# Patient Record
Sex: Female | Born: 1935 | Race: Black or African American | Hispanic: No | Smoking: Never smoker
Health system: Southern US, Community
[De-identification: ages and names within clinical notes are randomized; demographics above are authoritative.]

## PROBLEM LIST (undated history)

## (undated) DIAGNOSIS — C569 Malignant neoplasm of unspecified ovary: Secondary | ICD-10-CM

## (undated) DIAGNOSIS — F0390 Unspecified dementia without behavioral disturbance: Secondary | ICD-10-CM

## (undated) DIAGNOSIS — G629 Polyneuropathy, unspecified: Secondary | ICD-10-CM

## (undated) DIAGNOSIS — F329 Major depressive disorder, single episode, unspecified: Secondary | ICD-10-CM

## (undated) DIAGNOSIS — M1990 Unspecified osteoarthritis, unspecified site: Secondary | ICD-10-CM

## (undated) DIAGNOSIS — H409 Unspecified glaucoma: Secondary | ICD-10-CM

## (undated) DIAGNOSIS — F03A0 Unspecified dementia, mild, without behavioral disturbance, psychotic disturbance, mood disturbance, and anxiety: Secondary | ICD-10-CM

## (undated) DIAGNOSIS — H9190 Unspecified hearing loss, unspecified ear: Secondary | ICD-10-CM

## (undated) DIAGNOSIS — F32A Depression, unspecified: Secondary | ICD-10-CM

## (undated) DIAGNOSIS — I10 Essential (primary) hypertension: Secondary | ICD-10-CM

## (undated) HISTORY — PX: CATARACT EXTRACTION, BILATERAL: SHX1313

## (undated) HISTORY — PX: PARTIAL HYSTERECTOMY: SHX80

## (undated) HISTORY — PX: SPINE SURGERY: SHX786

---

## 2015-10-27 ENCOUNTER — Telehealth: Payer: Self-pay | Admitting: Cardiovascular Disease

## 2015-10-27 NOTE — Telephone Encounter (Signed)
New MEssage  Pt grand daughter called to cancel appt w/ Dr Burt Knack for 6/15- and requested to speak w/ RN. Please call back and discuss.

## 2015-10-27 NOTE — Telephone Encounter (Signed)
I left Yonna a detailed message in regards to pt's appointment.  Apurva plans to contact the office and reschedule appointment once the pt is enrolled in [facility] program and medical records are received.

## 2015-10-29 ENCOUNTER — Ambulatory Visit: Payer: Self-pay | Admitting: Cardiovascular Disease

## 2016-03-21 ENCOUNTER — Other Ambulatory Visit: Payer: Self-pay | Admitting: Nurse Practitioner

## 2016-03-21 ENCOUNTER — Ambulatory Visit
Admission: RE | Admit: 2016-03-21 | Discharge: 2016-03-21 | Disposition: A | Discharge status: Home / Self Care | Payer: No Typology Code available for payment source | Source: Ambulatory Visit | Attending: Nurse Practitioner | Admitting: Nurse Practitioner

## 2016-03-21 DIAGNOSIS — R109 Unspecified abdominal pain: Secondary | ICD-10-CM

## 2017-04-13 ENCOUNTER — Observation Stay (HOSPITAL_COMMUNITY)
Admission: EM | Admit: 2017-04-13 | Discharge: 2017-04-14 | Disposition: A | Discharge status: Home / Self Care | Payer: Medicare (Managed Care) | Attending: Internal Medicine | Admitting: Internal Medicine

## 2017-04-13 ENCOUNTER — Emergency Department (HOSPITAL_COMMUNITY): Payer: Medicare (Managed Care)

## 2017-04-13 ENCOUNTER — Encounter (HOSPITAL_COMMUNITY): Payer: Self-pay | Admitting: Emergency Medicine

## 2017-04-13 DIAGNOSIS — R932 Abnormal findings on diagnostic imaging of liver and biliary tract: Secondary | ICD-10-CM | POA: Insufficient documentation

## 2017-04-13 DIAGNOSIS — I82411 Acute embolism and thrombosis of right femoral vein: Secondary | ICD-10-CM | POA: Insufficient documentation

## 2017-04-13 DIAGNOSIS — F0390 Unspecified dementia without behavioral disturbance: Secondary | ICD-10-CM | POA: Diagnosis not present

## 2017-04-13 DIAGNOSIS — Z8543 Personal history of malignant neoplasm of ovary: Secondary | ICD-10-CM | POA: Insufficient documentation

## 2017-04-13 DIAGNOSIS — K7689 Other specified diseases of liver: Secondary | ICD-10-CM | POA: Diagnosis present

## 2017-04-13 DIAGNOSIS — Z91018 Allergy to other foods: Secondary | ICD-10-CM | POA: Insufficient documentation

## 2017-04-13 DIAGNOSIS — Z79899 Other long term (current) drug therapy: Secondary | ICD-10-CM | POA: Diagnosis not present

## 2017-04-13 DIAGNOSIS — R1084 Generalized abdominal pain: Secondary | ICD-10-CM

## 2017-04-13 DIAGNOSIS — G629 Polyneuropathy, unspecified: Secondary | ICD-10-CM | POA: Insufficient documentation

## 2017-04-13 DIAGNOSIS — Z9841 Cataract extraction status, right eye: Secondary | ICD-10-CM | POA: Diagnosis not present

## 2017-04-13 DIAGNOSIS — R1010 Upper abdominal pain, unspecified: Secondary | ICD-10-CM

## 2017-04-13 DIAGNOSIS — R0789 Other chest pain: Secondary | ICD-10-CM | POA: Diagnosis not present

## 2017-04-13 DIAGNOSIS — Z66 Do not resuscitate: Secondary | ICD-10-CM | POA: Diagnosis not present

## 2017-04-13 DIAGNOSIS — R109 Unspecified abdominal pain: Secondary | ICD-10-CM | POA: Diagnosis present

## 2017-04-13 DIAGNOSIS — H409 Unspecified glaucoma: Secondary | ICD-10-CM | POA: Insufficient documentation

## 2017-04-13 DIAGNOSIS — I4581 Long QT syndrome: Secondary | ICD-10-CM | POA: Diagnosis not present

## 2017-04-13 DIAGNOSIS — Z880 Allergy status to penicillin: Secondary | ICD-10-CM | POA: Insufficient documentation

## 2017-04-13 DIAGNOSIS — Z90710 Acquired absence of both cervix and uterus: Secondary | ICD-10-CM | POA: Diagnosis not present

## 2017-04-13 DIAGNOSIS — I10 Essential (primary) hypertension: Secondary | ICD-10-CM | POA: Diagnosis present

## 2017-04-13 DIAGNOSIS — Z9842 Cataract extraction status, left eye: Secondary | ICD-10-CM | POA: Diagnosis not present

## 2017-04-13 DIAGNOSIS — I82419 Acute embolism and thrombosis of unspecified femoral vein: Secondary | ICD-10-CM | POA: Diagnosis present

## 2017-04-13 DIAGNOSIS — R079 Chest pain, unspecified: Secondary | ICD-10-CM

## 2017-04-13 DIAGNOSIS — F329 Major depressive disorder, single episode, unspecified: Secondary | ICD-10-CM | POA: Diagnosis not present

## 2017-04-13 DIAGNOSIS — Z9889 Other specified postprocedural states: Secondary | ICD-10-CM | POA: Diagnosis not present

## 2017-04-13 DIAGNOSIS — E739 Lactose intolerance, unspecified: Secondary | ICD-10-CM | POA: Insufficient documentation

## 2017-04-13 HISTORY — DX: Unspecified osteoarthritis, unspecified site: M19.90

## 2017-04-13 HISTORY — DX: Malignant neoplasm of unspecified ovary: C56.9

## 2017-04-13 HISTORY — DX: Unspecified hearing loss, unspecified ear: H91.90

## 2017-04-13 HISTORY — DX: Unspecified dementia, mild, without behavioral disturbance, psychotic disturbance, mood disturbance, and anxiety: F03.A0

## 2017-04-13 HISTORY — DX: Unspecified dementia without behavioral disturbance: F03.90

## 2017-04-13 HISTORY — DX: Essential (primary) hypertension: I10

## 2017-04-13 HISTORY — DX: Unspecified glaucoma: H40.9

## 2017-04-13 HISTORY — DX: Major depressive disorder, single episode, unspecified: F32.9

## 2017-04-13 HISTORY — DX: Depression, unspecified: F32.A

## 2017-04-13 HISTORY — DX: Polyneuropathy, unspecified: G62.9

## 2017-04-13 LAB — URINALYSIS, ROUTINE W REFLEX MICROSCOPIC
Bilirubin Urine: NEGATIVE
Glucose, UA: NEGATIVE mg/dL
Hgb urine dipstick: NEGATIVE
Ketones, ur: NEGATIVE mg/dL
Nitrite: NEGATIVE
PH: 7 (ref 5.0–8.0)
Protein, ur: NEGATIVE mg/dL
SPECIFIC GRAVITY, URINE: 1.014 (ref 1.005–1.030)

## 2017-04-13 LAB — I-STAT TROPONIN, ED: TROPONIN I, POC: 0 ng/mL (ref 0.00–0.08)

## 2017-04-13 LAB — COMPREHENSIVE METABOLIC PANEL
ALBUMIN: 3.8 g/dL (ref 3.5–5.0)
ALT: 14 U/L (ref 14–54)
AST: 22 U/L (ref 15–41)
Alkaline Phosphatase: 81 U/L (ref 38–126)
Anion gap: 9 (ref 5–15)
BILIRUBIN TOTAL: 0.5 mg/dL (ref 0.3–1.2)
BUN: 12 mg/dL (ref 6–20)
CHLORIDE: 104 mmol/L (ref 101–111)
CO2: 28 mmol/L (ref 22–32)
Calcium: 10.9 mg/dL — ABNORMAL HIGH (ref 8.9–10.3)
Creatinine, Ser: 0.67 mg/dL (ref 0.44–1.00)
GFR calc Af Amer: >60 mL/min (ref >60)
GFR calc non Af Amer: >60 mL/min (ref >60)
GLUCOSE: 114 mg/dL — AB (ref 65–99)
POTASSIUM: 3.4 mmol/L — AB (ref 3.5–5.1)
Sodium: 141 mmol/L (ref 135–145)
Total Protein: 8.2 g/dL — ABNORMAL HIGH (ref 6.5–8.1)

## 2017-04-13 LAB — CBC
HEMATOCRIT: 40.9 % (ref 36.0–46.0)
Hemoglobin: 13.4 g/dL (ref 12.0–15.0)
MCH: 28.6 pg (ref 26.0–34.0)
MCHC: 32.8 g/dL (ref 30.0–36.0)
MCV: 87.2 fL (ref 78.0–100.0)
Platelets: 227 10*3/uL (ref 150–400)
RBC: 4.69 MIL/uL (ref 3.87–5.11)
RDW: 14.9 % (ref 11.5–15.5)
WBC: 10.9 10*3/uL — ABNORMAL HIGH (ref 4.0–10.5)

## 2017-04-13 LAB — TROPONIN I: Troponin I: <0.03 ng/mL (ref <0.03)

## 2017-04-13 LAB — LIPASE, BLOOD: LIPASE: 22 U/L (ref 11–51)

## 2017-04-13 MED ORDER — HYDRALAZINE HCL 20 MG/ML IJ SOLN: 5.0000 mg | INTRAMUSCULAR | Status: DC | PRN

## 2017-04-13 MED ORDER — IOPAMIDOL (ISOVUE-300) INJECTION 61%
INTRAVENOUS | Status: AC
  Administered 2017-04-13: 100 mL via INTRAVENOUS
  Filled 2017-04-13: qty 100

## 2017-04-13 MED ORDER — PANTOPRAZOLE SODIUM 40 MG IV SOLR
40.0000 mg | Freq: Once | INTRAVENOUS | Status: AC
  Administered 2017-04-13: 40 mg via INTRAVENOUS
  Filled 2017-04-13: qty 40

## 2017-04-13 NOTE — ED Triage Notes (Addendum)
Per EMS, patient from [facility], c/o RUQ pain x1 day. Reports pain decreases when patient passes gas. Denies N/V/D. Hx early onset dementia.  Patient also c/o cough x1 week with increased SOB since last night. Denies chest pain. A&Ox4.   BP 190/100 O2 96% 4L

## 2017-04-13 NOTE — ED Provider Notes (Signed)
[facility] DEPT Provider Note   CSN: [id number] Arrival date & time: 04/13/17  1347     History   Chief Complaint Chief Complaint  Patient presents with  . Abdominal Pain    HPI Karen Thompson is a 83 y.o. female.  Patient complains of abdominal pain and chest pain today.   The history is provided by the patient. No language interpreter was used.  Abdominal Pain   This is a new problem. The current episode started 12 to 24 hours ago. The problem occurs constantly. The problem has not changed since onset.The pain is associated with an unknown factor. The pain is located in the epigastric region. The quality of the pain is dull. The pain is at a severity of 5/10. The pain is moderate. Pertinent negatives include anorexia, diarrhea, frequency, hematuria and headaches. Nothing aggravates the symptoms.    History reviewed. No pertinent past medical history.  There are no active problems to display for this patient.   History reviewed. No pertinent surgical history.  OB History    No data available       Home Medications    Prior to Admission medications   Medication Sig Start Date End Date Taking? Authorizing Provider  acetaminophen (TYLENOL 8 HOUR) 650 MG CR tablet Take 650 mg by mouth every 8 (eight) hours as needed for pain.   Yes [provider]  amLODipine (NORVASC) 5 MG tablet Take 5 mg by mouth daily.   Yes [provider]  Brinzolamide-Brimonidine (SIMBRINZA) 1-0.2 % SUSP Apply 1 drop to eye 2 (two) times daily.   Yes [provider]  cholecalciferol (VITAMIN D) 1000 units tablet Take 1,000 Units by mouth daily.   Yes [provider]  cholestyramine light (PREVALITE) 4 GM/DOSE powder Take 4 g by mouth daily as needed (diarrhea).   Yes [provider]  citalopram (CELEXA) 20 MG tablet Take 20 mg by mouth daily.   Yes [provider]  fexofenadine (ALLEGRA) 180 MG tablet Take 180 mg  by mouth daily.   Yes [provider]  gabapentin (NEURONTIN) 100 MG capsule Take 100-200 mg by mouth 2 (two) times daily. Take 1 capsule in the am and Take 2 capsules in the afternoon.   Yes [provider]  Liniments (SALONPAS EX) Apply 1 patch topically 3 (three) times daily as needed (pain).   Yes [provider]  memantine (NAMENDA) 10 MG tablet Take 10 mg by mouth 2 (two) times daily.   Yes [provider]  Menthol, Topical Analgesic, (BIOFREEZE) 4 % GEL Apply 1 application topically 2 (two) times daily as needed (pain).   Yes [provider]  mirtazapine (REMERON) 15 MG tablet Take 15 mg by mouth at bedtime.   Yes [provider]  Netarsudil Dimesylate (RHOPRESSA) 0.02 % SOLN Apply 1 drop to eye at bedtime.   Yes [provider]  olmesartan (BENICAR) 40 MG tablet Take 40 mg by mouth daily.   Yes [provider]  travoprost, benzalkonium, (TRAVATAN) 0.004 % ophthalmic solution Place 1 drop into both eyes at bedtime.   Yes [provider]  ENSURE (ENSURE) Take 237 mLs by mouth daily.    [provider]    Family History No family history on file.  Social History Social History   Tobacco Use  . Smoking status: Not on file  Substance Use Topics  . Alcohol use: Not on file  . Drug use: Not on file  Allergies   Lactose intolerance (gi); Penicillins; and Tomato   Review of Systems Review of Systems  Constitutional: Negative for appetite change and fatigue.  HENT: Negative for congestion, ear discharge and sinus pressure.   Eyes: Negative for discharge.  Respiratory: Negative for cough.   Cardiovascular: Negative for chest pain.  Gastrointestinal: Positive for abdominal pain. Negative for anorexia and diarrhea.  Genitourinary: Negative for frequency and hematuria.  Musculoskeletal: Negative for back pain.  Skin: Negative for rash.  Neurological: Negative for seizures and headaches.    Psychiatric/Behavioral: Negative for hallucinations.     Physical Exam Updated Vital Signs BP (!) 182/77   Pulse 90   Temp 98.4 F (36.9 C) (Oral)   Resp (!) 25   SpO2 92%   Physical Exam  Constitutional: She is oriented to person, place, and time. She appears well-developed.  HENT:  Head: Normocephalic.  Eyes: Conjunctivae and EOM are normal. No scleral icterus.  Neck: Neck supple. No thyromegaly present.  Cardiovascular: Normal rate and regular rhythm. Exam reveals no gallop and no friction rub.  No murmur heard. Pulmonary/Chest: No stridor. She has no wheezes. She has no rales. She exhibits no tenderness.  Abdominal: She exhibits no distension. There is tenderness. There is no rebound.  Tenderness to epigastric area  Musculoskeletal: Normal range of motion. She exhibits no edema.  Lymphadenopathy:    She has no cervical adenopathy.  Neurological: She is oriented to person, place, and time. She exhibits normal muscle tone. Coordination normal.  Skin: No rash noted. No erythema.  Psychiatric: She has a normal mood and affect. Her behavior is normal.     ED Treatments / Results  Labs (all labs ordered are listed, but only abnormal results are displayed) Labs Reviewed  COMPREHENSIVE METABOLIC PANEL - Abnormal; Notable for the following components:      Result Value   Potassium 3.4 (*)    Glucose, Bld 114 (*)    Calcium 10.9 (*)    Total Protein 8.2 (*)    All other components within normal limits  CBC - Abnormal; Notable for the following components:   WBC 10.9 (*)    All other components within normal limits  URINALYSIS, ROUTINE W REFLEX MICROSCOPIC - Abnormal; Notable for the following components:   Leukocytes, UA TRACE (*)    Bacteria, UA RARE (*)    Squamous Epithelial / LPF 0-5 (*)    All other components within normal limits  LIPASE, BLOOD  TROPONIN I  I-STAT TROPONIN, ED    EKG  EKG Interpretation  Date/Time:  Thursday April 13 2017 14:17:51  EST Ventricular Rate:  78 PR Interval:    QRS Duration: 86 QT Interval:  533 QTC Calculation: 608 R Axis:   51 Text Interpretation:  Sinus rhythm Left ventricular hypertrophy Prolonged QT interval Baseline wander in lead(s) II aVF V3 V6 Confirmed by Milton Ferguson [phone]) on 04/13/2017 9:48:00 PM       Radiology Dg Chest 2 View  Result Date: 04/13/2017 CLINICAL DATA:  Chest pain and cough for 2 days. EXAM: CHEST  2 VIEW COMPARISON:  03/21/2016 FINDINGS: AP and lateral views of the chest show low volumes. Bibasilar airspace disease is evident. Cardiopericardial silhouette is at upper limits of normal for size. Bones are diffusely demineralized. Telemetry leads overlie the chest. IMPRESSION: Bibasilar airspace disease. This may be atelectatic although pneumonia is not excluded. Electronically Signed   By: Misty Stanley M.D.   On: 04/13/2017 15:58   Ct Abdomen Pelvis W Contrast  Result Date: 04/13/2017 CLINICAL DATA:  Abdominal distension.  Right upper quadrant pain. EXAM: CT ABDOMEN AND PELVIS WITH CONTRAST TECHNIQUE: Multidetector CT imaging of the abdomen and pelvis was performed using the standard protocol following bolus administration of intravenous contrast. CONTRAST:  175mL ISOVUE-300 IOPAMIDOL (ISOVUE-300) INJECTION 61% COMPARISON:  Chest radiograph today. FINDINGS: Lower chest: Breathing motion artifact limits assessment. Patchy bibasilar opacities, right greater than left. Heart size upper normal. Aortic atherosclerosis. Hepatobiliary: Motion artifact the liver. Multiple low-density hepatic lesions throughout both lobes. Largest lesion in the left lobe measures 2.7 cm. Many of these lesions represent simple cysts, however some are intermediate density, for example image 20 series 2 in the right and left lobes. Simple fluid density is confirmed in some of these lesion on delayed phase imaging, and it is unclear whether the intermediate density is secondary to volume averaging secondary to  motion or true non cystic nature of these lesions. Gallbladder is partially distended. No calcified gallstone. Prominent distal common bile duct 8 mm distally. Pancreas: Motion artifact. Parenchymal atrophy. No evidence peripancreatic inflammation. No definite ductal dilatation. Spleen: Motion artifacts.  Normal in size. Adrenals/Urinary Tract: No definite adrenal nodule, motion limited exam. No hydronephrosis or perinephric edema. Partially duplicated left renal collecting system. Multiple bilateral renal cysts, largest on the left kidney anteriorly midportion measuring 3.8 cm. Stomach/Bowel: Stomach physiologically distended. No evidence of bowel inflammation, wall thickening or obstruction. Descending and sigmoid diverticulosis without diverticulitis. Moderate colonic stool burden. The appendix is not visualized, no pericecal or right lower quadrant inflammation. Vascular/Lymphatic: Low-density filling defect in the right common femoral and included femoral vein consistent with DVT. Moderate to advanced aortic atherosclerosis and tortuosity. Advanced bi-iliac atherosclerosis with at least high-grade stenosis of the left external iliac artery, distal flow is noted. Examination not tailored for arterial evaluation. No aneurysm. Motion limits assessment for adenopathy, no bulky adenopathy is seen. Reproductive: Normal for age atrophic uterus. No definite adnexal mass, fluid-filled bowel in the pelvis partially limited adnexal assessment. Other: No ascites or free air.  No intra-abdominal abscess. Musculoskeletal: Moderate L2 compression fracture, likely remote. Multilevel degenerative change throughout spine. The bones are under mineralized. Bilateral iliac bone heterogeneity without defined lesion. IMPRESSION: 1. Hypodense hepatic lesions, which represent simple cysts. Some of these lesions may measure slightly higher than simple fluid density, however there is significant motion artifact through the liver and this  may be secondary to volume averaging. MRI is the preferred method for characterization, however given patient's inability to breath hold would be nondiagnostic in this case. 2. Filling defect within the right common femoral and femoral vein consistent with DVT, nonocclusive. 3. Colonic diverticulosis without diverticulitis. Moderate colonic stool burden can be seen with constipation, no fecal impaction or bowel obstruction. 4. Aorta bi-iliac atherosclerosis, moderate to advanced. 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 5. L2 compression fracture is likely remote. These results were called by telephone at the time of interpretation on 04/13/2017 at 9:25 pm to Dr. Milton Ferguson , who verbally acknowledged these results. Electronically Signed   By: Jeb Levering M.D.   On: 04/13/2017 21:25    Procedures Procedures (including critical care time)  Medications Ordered in ED Medications  iopamidol (ISOVUE-300) 61 % injection (100 mLs Intravenous Contrast Given 04/13/17 2026)  pantoprazole (PROTONIX) injection 40 mg (40 mg Intravenous Given 04/13/17 2159)     Initial Impression / Assessment and Plan / ED Course  I have reviewed the triage vital signs and the  nursing notes.  Pertinent labs & imaging results that were available during my care of the patient were reviewed by me and considered in my medical decision making (see chart for details).     Patient with abdominal pain and DVT.Marland Kitchen  Hospitalist will evaluate patient Final Clinical Impressions(s) / ED Diagnoses   Final diagnoses:  None    ED Discharge Orders    None       Milton Ferguson, MD 04/13/17 2233

## 2017-04-13 NOTE — ED Notes (Signed)
Bed: WA22 Expected date:  Expected time:  Means of arrival:  Comments: hold 

## 2017-04-13 NOTE — H&P (Signed)
History and Physical    Karen Thompson OJJ:[id number] DOB: [birth year] DOA: 04/13/2017  PCP:  Mikael Spray, [facility] Consultants:  None Patient coming from: Home - lives with granddaughter; NOK: Granddaughter, [phone]  Chief Complaint: abdominal pain  HPI: Karen Thompson is a 83 y.o. female with medical history significant of dementia; glaucoma; HTN; and depression presenting with abdominal pain.  Last night, she developed pain in her right side, thought it was gas.  This AM, she was SOB, breathing fast, and complained about the pain.   She started eating lunch and the pain started getting worse - substernal pain along her mid-abdomen and radiated to the right side.  She began feeling SOB again and laid her head down. She was at [facility] so they did vitals (elevated BP) and EKG (okay).  She rested but still wasn't better and so they sent her here.    [facility] was concerned that it was chest pain; the patient points to epigastric region.  No fevers.  Able to eat/drink.  No N/V/D/C.  Normal O2 sats in the ER (?low at [facility]). Also with R posterior leg pain. Right leg pain has been for about a week, periodically before that as well.  It has been swelling routinely.  She had a car trip to [place] for Thanksgiving.  ED Course: Complaining of chest and abdominal pain - more epigastric, abdominal.     Labs unremarkable.  CT with liver lesions - ?cysts; mildly distended gallbladder; also with DVT in R leg - has some mild pain and swelling.  Concern for Gastritis vs. Cholecystitis.  Review of Systems: As per HPI; otherwise review of systems reviewed and negative.   Ambulatory Status:  Ambulates with a cane or walker  Past Medical History:  Diagnosis Date  . Arthritis   . Depression   . Essential hypertension   . Glaucoma   . Hearing loss   . Mild dementia   . Neuropathy   . Ovarian cancer ([place])    remote    Past Surgical History:  Procedure Laterality Date  . CATARACT EXTRACTION,  BILATERAL    . PARTIAL HYSTERECTOMY    . SPINE SURGERY      Social History   Socioeconomic History  . Marital status: Unknown    Spouse name: Not on file  . Number of children: Not on file  . Years of education: Not on file  . Highest education level: Not on file  Social Needs  . Financial resource strain: Not on file  . Food insecurity - worry: Not on file  . Food insecurity - inability: Not on file  . Transportation needs - medical: Not on file  . Transportation needs - non-medical: Not on file  Occupational History  . Occupation: retired  Tobacco Use  . Smoking status: Never Smoker  . Smokeless tobacco: Current User    Types: Snuff  Substance and Sexual Activity  . Alcohol use: Yes    Frequency: Never    Comment: occasional wine  . Drug use: No  . Sexual activity: Not on file  Other Topics Concern  . Not on file  Social History Narrative  . Not on file    Allergies  Allergen Reactions  . Lactose Intolerance (Gi)   . Penicillins     Has patient had a PCN reaction causing immediate rash, facial/tongue/throat swelling, SOB or lightheadedness with hypotension: Y Has patient had a PCN reaction causing severe rash involving mucus membranes or skin necrosis:  N Has patient had a PCN reaction that required hospitalization: N Has patient had a PCN reaction occurring within the last 10 years: N If all of the above answers are "NO", then may proceed with Cephalosporin use.   . Tomato Itching and Rash    Raw tomatoes    History reviewed. No pertinent family history.  Prior to Admission medications   Medication Sig Start Date End Date Taking? Authorizing Provider  acetaminophen (TYLENOL 8 HOUR) 650 MG CR tablet Take 650 mg by mouth every 8 (eight) hours as needed for pain.   Yes [provider]  amLODipine (NORVASC) 5 MG tablet Take 5 mg by mouth daily.   Yes [provider]  Brinzolamide-Brimonidine (SIMBRINZA) 1-0.2 % SUSP Apply 1 drop to eye 2 (two)  times daily.   Yes [provider]  cholecalciferol (VITAMIN D) 1000 units tablet Take 1,000 Units by mouth daily.   Yes [provider]  cholestyramine light (PREVALITE) 4 GM/DOSE powder Take 4 g by mouth daily as needed (diarrhea).   Yes [provider]  citalopram (CELEXA) 20 MG tablet Take 20 mg by mouth daily.   Yes [provider]  fexofenadine (ALLEGRA) 180 MG tablet Take 180 mg by mouth daily.   Yes [provider]  gabapentin (NEURONTIN) 100 MG capsule Take 100-200 mg by mouth 2 (two) times daily. Take 1 capsule in the am and Take 2 capsules in the afternoon.   Yes [provider]  Liniments (SALONPAS EX) Apply 1 patch topically 3 (three) times daily as needed (pain).   Yes [provider]  memantine (NAMENDA) 10 MG tablet Take 10 mg by mouth 2 (two) times daily.   Yes [provider]  Menthol, Topical Analgesic, (BIOFREEZE) 4 % GEL Apply 1 application topically 2 (two) times daily as needed (pain).   Yes [provider]  mirtazapine (REMERON) 15 MG tablet Take 15 mg by mouth at bedtime.   Yes [provider]  Netarsudil Dimesylate (RHOPRESSA) 0.02 % SOLN Apply 1 drop to eye at bedtime.   Yes [provider]  olmesartan (BENICAR) 40 MG tablet Take 40 mg by mouth daily.   Yes [provider]  travoprost, benzalkonium, (TRAVATAN) 0.004 % ophthalmic solution Place 1 drop into both eyes at bedtime.   Yes [provider]  ENSURE (ENSURE) Take 237 mLs by mouth daily.    [provider]    Physical Exam: Vitals:   04/13/17 1930 04/13/17 1945 04/13/17 2000 04/13/17 2234  BP: (!) 181/78  (!) 182/77 (!) 166/79  Pulse: 91 90 90 89  Resp: 20 (!) 25 (!) 25 (!) 21  Temp:      TempSrc:      SpO2: 91% 93% 92% 91%     General:  Appears calm and comfortable and is NAD Eyes:  PERRL, EOMI, normal lids, iris ENT:  grossly normal hearing, lips & tongue, mmm; wears  dentures Neck:  no LAD, masses or thyromegaly; no carotid bruits Cardiovascular:  RRR, no m/r/g.  Respiratory:   CTA bilaterally with no wheezes/rales/rhonchi.  Normal respiratory effort. Abdomen:  soft, NT, ND, NABS Skin:  no rash or induration seen on limited exam Musculoskeletal:  grossly normal tone BUE/BLE, good ROM, no bony abnormality Lower extremity:  Marked RLE edema extending up to mid-thigh.  Limited foot exam with no ulcerations.  2+ distal pulses. Psychiatric:  grossly normal mood and affect, speech fluent and appropriate Neurologic:  CN 2-12 grossly intact, moves all  extremities in coordinated fashion, sensation intact    Radiological Exams on Admission: Dg Chest 2 View  Result Date: 04/13/2017 CLINICAL DATA:  Chest pain and cough for 2 days. EXAM: CHEST  2 VIEW COMPARISON:  03/21/2016 FINDINGS: AP and lateral views of the chest show low volumes. Bibasilar airspace disease is evident. Cardiopericardial silhouette is at upper limits of normal for size. Bones are diffusely demineralized. Telemetry leads overlie the chest. IMPRESSION: Bibasilar airspace disease. This may be atelectatic although pneumonia is not excluded. Electronically Signed   By: Misty Stanley M.D.   On: 04/13/2017 15:58   Ct Abdomen Pelvis W Contrast  Result Date: 04/13/2017 CLINICAL DATA:  Abdominal distension.  Right upper quadrant pain. EXAM: CT ABDOMEN AND PELVIS WITH CONTRAST TECHNIQUE: Multidetector CT imaging of the abdomen and pelvis was performed using the standard protocol following bolus administration of intravenous contrast. CONTRAST:  16mL ISOVUE-300 IOPAMIDOL (ISOVUE-300) INJECTION 61% COMPARISON:  Chest radiograph today. FINDINGS: Lower chest: Breathing motion artifact limits assessment. Patchy bibasilar opacities, right greater than left. Heart size upper normal. Aortic atherosclerosis. Hepatobiliary: Motion artifact the liver. Multiple low-density hepatic lesions throughout both lobes. Largest  lesion in the left lobe measures 2.7 cm. Many of these lesions represent simple cysts, however some are intermediate density, for example image 20 series 2 in the right and left lobes. Simple fluid density is confirmed in some of these lesion on delayed phase imaging, and it is unclear whether the intermediate density is secondary to volume averaging secondary to motion or true non cystic nature of these lesions. Gallbladder is partially distended. No calcified gallstone. Prominent distal common bile duct 8 mm distally. Pancreas: Motion artifact. Parenchymal atrophy. No evidence peripancreatic inflammation. No definite ductal dilatation. Spleen: Motion artifacts.  Normal in size. Adrenals/Urinary Tract: No definite adrenal nodule, motion limited exam. No hydronephrosis or perinephric edema. Partially duplicated left renal collecting system. Multiple bilateral renal cysts, largest on the left kidney anteriorly midportion measuring 3.8 cm. Stomach/Bowel: Stomach physiologically distended. No evidence of bowel inflammation, wall thickening or obstruction. Descending and sigmoid diverticulosis without diverticulitis. Moderate colonic stool burden. The appendix is not visualized, no pericecal or right lower quadrant inflammation. Vascular/Lymphatic: Low-density filling defect in the right common femoral and included femoral vein consistent with DVT. Moderate to advanced aortic atherosclerosis and tortuosity. Advanced bi-iliac atherosclerosis with at least high-grade stenosis of the left external iliac artery, distal flow is noted. Examination not tailored for arterial evaluation. No aneurysm. Motion limits assessment for adenopathy, no bulky adenopathy is seen. Reproductive: Normal for age atrophic uterus. No definite adnexal mass, fluid-filled bowel in the pelvis partially limited adnexal assessment. Other: No ascites or free air.  No intra-abdominal abscess. Musculoskeletal: Moderate L2 compression fracture, likely  remote. Multilevel degenerative change throughout spine. The bones are under mineralized. Bilateral iliac bone heterogeneity without defined lesion. IMPRESSION: 1. Hypodense hepatic lesions, which represent simple cysts. Some of these lesions may measure slightly higher than simple fluid density, however there is significant motion artifact through the liver and this may be secondary to volume averaging. MRI is the preferred method for characterization, however given patient's inability to breath hold would be nondiagnostic in this case. 2. Filling defect within the right common femoral and femoral vein consistent with DVT, nonocclusive. 3. Colonic diverticulosis without diverticulitis. Moderate colonic stool burden can be seen with constipation, no fecal impaction or bowel obstruction. 4. Aorta bi-iliac atherosclerosis, moderate to advanced. 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 5.  L2 compression fracture is likely remote. These results were called by telephone at the time of interpretation on 04/13/2017 at 9:25 pm to Dr. Milton Ferguson , who verbally acknowledged these results. Electronically Signed   By: Jeb Levering M.D.   On: 04/13/2017 21:25    EKG: Independently reviewed.  NSR with rate 78; prolonged QT 608, LVH; nonspecific ST changes with no evidence of acute ischemia   Labs on Admission: I have personally reviewed the available labs and imaging studies at the time of the admission.  Pertinent labs:   Glucose 114 Calcium 10.9 Total protein 8.2 Troponin <0.03, 0.00 WBC 10.9 UA: trace LE, rare bacteria   Assessment/Plan Principal Problem:   Abdominal pain Active Problems:   Chest pain   Dvt femoral (deep venous thrombosis) (HCC)   Essential hypertension   Dementia   Liver cyst   Chest vs. Abdominal pain -Patient with likely midepigastric leading to diffuse abdominal pain -However, she is not clearly able to define it as not  substernal in nature -The pain worsened with movement and led to SOB; while again this could be anginal pain, the description appeared to be more consistent with MSK-related pain and resulting hyperventilation due to the pain. -CXR unremarkable.   -Initial cardiac troponin negative.  -EKG not indicative of acute ischemia.    -Will plan to place in observation status on telemetry to rule out ACS by overnight observation to be safe.  -cycle troponin q6h x 3 and repeat EKG in AM -Start ASA 81 mg  daily -morphine given -Will check TSH -Anticipate improvement by tomorrow and then patient is likely appropriate for discharge  HTN -Takes Norvasc and Benicar at home -Patient with suboptimal control while in the ER -Will add prn IV hydralazine  DVT -Patient without prior episodes of thromboembolic disease presenting with new DVT -Source is likely prolonged car trip over Thanksgiving -Initiate anticoagulation - for now, will start treatment-dose Lovenox -She is likely able to transition to alternative [facility] agent tomorrow - will need CM assistance to confirm with [facility] what is acceptable -Treatment duration is likely 3 months -Of note, she did not have a chest CTA performed and so PE cannot be fully ruled out, but at this time it would not change the treatment course (although the duration of treatment could arguably be extended because of this). -She does also have PAD noted, but this she appears to be asymptomatic about this at this time  Liver cysts -Patient noted to have multiple probable simple liver cysts on CT -No further f/u is likely needed at this time given the patient's age and the generally benign appearance on CT  Dementia -Continue Namenda -Also with depression, on Celexa and Remeron   DVT prophylaxis: Treatment-dose Lovenox Code Status:  DNR - confirmed with patient/family Family Communication: Granddaughter present throughout evaluation  Disposition Plan:  Home once clinically  improved Consults called: None  Admission status: It is my clinical opinion that referral for OBSERVATION is reasonable and necessary in this patient based on the above information provided. The aforementioned taken together are felt to place the patient at high risk for further clinical deterioration. However it is anticipated that the patient may be medically stable for discharge from the hospital within 24 to 48 hours.     Karmen Bongo MD [facility]  If note is complete, please contact covering daytime or nighttime physician. [email or web address] Password [facility]  04/13/2017, 11:54 PM

## 2017-04-13 NOTE — ED Notes (Signed)
Patient transported to CT 

## 2017-04-14 ENCOUNTER — Other Ambulatory Visit: Payer: Self-pay

## 2017-04-14 DIAGNOSIS — F0150 Vascular dementia without behavioral disturbance: Secondary | ICD-10-CM

## 2017-04-14 DIAGNOSIS — I82411 Acute embolism and thrombosis of right femoral vein: Secondary | ICD-10-CM | POA: Diagnosis not present

## 2017-04-14 DIAGNOSIS — K7689 Other specified diseases of liver: Secondary | ICD-10-CM | POA: Diagnosis not present

## 2017-04-14 DIAGNOSIS — R932 Abnormal findings on diagnostic imaging of liver and biliary tract: Secondary | ICD-10-CM | POA: Diagnosis not present

## 2017-04-14 DIAGNOSIS — I10 Essential (primary) hypertension: Secondary | ICD-10-CM | POA: Diagnosis not present

## 2017-04-14 DIAGNOSIS — R109 Unspecified abdominal pain: Secondary | ICD-10-CM | POA: Diagnosis not present

## 2017-04-14 LAB — TROPONIN I
Troponin I: <0.03 ng/mL (ref <0.03)
Troponin I: <0.03 ng/mL (ref <0.03)

## 2017-04-14 MED ORDER — NETARSUDIL DIMESYLATE 0.02 % OP SOLN: 1.0000 [drp] | Freq: Every day | OPHTHALMIC | Status: DC

## 2017-04-14 MED ORDER — ACETAMINOPHEN 325 MG PO TABS
650.0000 mg | ORAL_TABLET | ORAL | Status: DC | PRN
  Administered 2017-04-14: 650 mg via ORAL
  Filled 2017-04-14: qty 2

## 2017-04-14 MED ORDER — APIXABAN 5 MG PO TABS: 5.0000 mg | ORAL_TABLET | Freq: Two times a day (BID) | ORAL | 1 refills | Status: DC

## 2017-04-14 MED ORDER — GI COCKTAIL ~~LOC~~: 30.0000 mL | Freq: Four times a day (QID) | ORAL | Status: DC | PRN

## 2017-04-14 MED ORDER — APIXABAN 5 MG PO TABS: 10.0000 mg | ORAL_TABLET | Freq: Two times a day (BID) | ORAL | 0 refills | Status: DC

## 2017-04-14 MED ORDER — GABAPENTIN 100 MG PO CAPS
100.0000 mg | ORAL_CAPSULE | Freq: Every day | ORAL | Status: DC
Start: 2017-04-14 — End: 2017-04-14
  Administered 2017-04-14: 100 mg via ORAL
  Filled 2017-04-14: qty 1

## 2017-04-14 MED ORDER — MEMANTINE HCL 10 MG PO TABS
10.0000 mg | ORAL_TABLET | Freq: Two times a day (BID) | ORAL | Status: DC
  Administered 2017-04-14 (×2): 10 mg via ORAL
  Filled 2017-04-14 (×2): qty 1

## 2017-04-14 MED ORDER — ASPIRIN EC 81 MG PO TBEC
81.0000 mg | DELAYED_RELEASE_TABLET | Freq: Every day | ORAL | Status: DC
  Administered 2017-04-14: 81 mg via ORAL
  Filled 2017-04-14: qty 1

## 2017-04-14 MED ORDER — APIXABAN 5 MG PO TABS
10.0000 mg | ORAL_TABLET | Freq: Two times a day (BID) | ORAL | Status: DC
  Administered 2017-04-14: 10 mg via ORAL
  Filled 2017-04-14: qty 2

## 2017-04-14 MED ORDER — CHLORHEXIDINE GLUCONATE 0.12 % MT SOLN
15.0000 mL | Freq: Two times a day (BID) | OROMUCOSAL | Status: DC
  Administered 2017-04-14: 15 mL via OROMUCOSAL
  Filled 2017-04-14: qty 15

## 2017-04-14 MED ORDER — ENSURE ENLIVE PO LIQD: 237.0000 mL | Freq: Two times a day (BID) | ORAL | Status: DC

## 2017-04-14 MED ORDER — IRBESARTAN 150 MG PO TABS
300.0000 mg | ORAL_TABLET | Freq: Every day | ORAL | Status: DC
  Administered 2017-04-14: 300 mg via ORAL
  Filled 2017-04-14: qty 2

## 2017-04-14 MED ORDER — CITALOPRAM HYDROBROMIDE 20 MG PO TABS
20.0000 mg | ORAL_TABLET | Freq: Every day | ORAL | Status: DC
  Administered 2017-04-14: 20 mg via ORAL
  Filled 2017-04-14: qty 1

## 2017-04-14 MED ORDER — LATANOPROST 0.005 % OP SOLN
1.0000 [drp] | Freq: Every day | OPHTHALMIC | Status: DC
  Administered 2017-04-14: 1 [drp] via OPHTHALMIC
  Filled 2017-04-14: qty 2.5

## 2017-04-14 MED ORDER — APIXABAN 5 MG PO TABS: 5.0000 mg | ORAL_TABLET | Freq: Two times a day (BID) | ORAL | Status: DC

## 2017-04-14 MED ORDER — LORATADINE 10 MG PO TABS
10.0000 mg | ORAL_TABLET | Freq: Every day | ORAL | Status: DC
  Administered 2017-04-14: 10 mg via ORAL
  Filled 2017-04-14: qty 1

## 2017-04-14 MED ORDER — ENOXAPARIN SODIUM 60 MG/0.6ML ~~LOC~~ SOLN
60.0000 mg | Freq: Two times a day (BID) | SUBCUTANEOUS | Status: DC
  Administered 2017-04-14: 03:00:00 60 mg via SUBCUTANEOUS
  Filled 2017-04-14 (×2): qty 0.6

## 2017-04-14 MED ORDER — MIRTAZAPINE 15 MG PO TABS
15.0000 mg | ORAL_TABLET | Freq: Every day | ORAL | Status: DC
  Administered 2017-04-14: 15 mg via ORAL
  Filled 2017-04-14: qty 1

## 2017-04-14 MED ORDER — GABAPENTIN 100 MG PO CAPS
200.0000 mg | ORAL_CAPSULE | Freq: Every day | ORAL | Status: DC
  Administered 2017-04-14: 200 mg via ORAL
  Filled 2017-04-14: qty 2

## 2017-04-14 MED ORDER — AMLODIPINE BESYLATE 5 MG PO TABS
5.0000 mg | ORAL_TABLET | Freq: Every day | ORAL | Status: DC
  Administered 2017-04-14: 5 mg via ORAL
  Filled 2017-04-14: qty 1

## 2017-04-14 MED ORDER — MORPHINE SULFATE (PF) 4 MG/ML IV SOLN: 2.0000 mg | INTRAVENOUS | Status: DC | PRN

## 2017-04-14 MED ORDER — ONDANSETRON HCL 4 MG/2ML IJ SOLN
4.0000 mg | Freq: Four times a day (QID) | INTRAMUSCULAR | Status: DC | PRN
  Filled 2017-04-14: qty 2

## 2017-04-14 MED ORDER — ORAL CARE MOUTH RINSE: 15.0000 mL | Freq: Two times a day (BID) | OROMUCOSAL | Status: DC

## 2017-04-14 MED ORDER — TRAVOPROST 0.004 % OP SOLN: 1.0000 [drp] | Freq: Every day | OPHTHALMIC | Status: DC

## 2017-04-14 NOTE — Discharge Instructions (Signed)
Information on my medicine - ELIQUIS (apixaban)   Why was Eliquis prescribed for you? Eliquis was prescribed to treat blood clots that may have been found in the veins of your legs (deep vein thrombosis) or in your lungs (pulmonary embolism) and to reduce the risk of them occurring again.  What do You need to know about Eliquis ? The starting dose is 10 mg (two 5 mg tablets) taken TWICE daily for the FIRST SEVEN (7) DAYS, then on (enter date)  04/21/17  the dose is reduced to ONE 5 mg tablet taken TWICE daily.  Eliquis may be taken with or without food.   Try to take the dose about the same time in the morning and in the evening. If you have difficulty swallowing the tablet whole please discuss with your pharmacist how to take the medication safely.  Take Eliquis exactly as prescribed and DO NOT stop taking Eliquis without talking to the doctor who prescribed the medication.  Stopping may increase your risk of developing a new blood clot.  Refill your prescription before you run out.  After discharge, you should have regular check-up appointments with your healthcare provider that is prescribing your Eliquis.    What do you do if you miss a dose? If a dose of ELIQUIS is not taken at the scheduled time, take it as soon as possible on the same day and twice-daily administration should be resumed. The dose should not be doubled to make up for a missed dose.  Important Safety Information A possible side effect of Eliquis is bleeding. You should call your healthcare provider right away if you experience any of the following: ? Bleeding from an injury or your nose that does not stop. ? Unusual colored urine (red or dark brown) or unusual colored stools (red or black). ? Unusual bruising for unknown reasons. ? A serious fall or if you hit your head (even if there is no bleeding).  Some medicines may interact with Eliquis and might increase your risk of bleeding or clotting while on  Eliquis. To help avoid this, consult your healthcare provider or pharmacist prior to using any new prescription or non-prescription medications, including herbals, vitamins, non-steroidal anti-inflammatory drugs (NSAIDs) and supplements.  This website has more information on Eliquis (apixaban): http://www.eliquis.com/eliquis/home

## 2017-04-14 NOTE — Progress Notes (Signed)
ANTICOAGULATION CONSULT NOTE - Initial Consult  Pharmacy Consult for Lovenox Indication: DVT (right femoral vein)  Allergies  Allergen Reactions  . Lactose Intolerance (Gi)   . Penicillins     Has patient had a PCN reaction causing immediate rash, facial/tongue/throat swelling, SOB or lightheadedness with hypotension: Y Has patient had a PCN reaction causing severe rash involving mucus membranes or skin necrosis: N Has patient had a PCN reaction that required hospitalization: N Has patient had a PCN reaction occurring within the last 10 years: N If all of the above answers are "NO", then may proceed with Cephalosporin use.   . Tomato Itching and Rash    Raw tomatoes        Vital Signs: Temp: 99.2 F (37.3 C) (11/30 0135) Temp Source: Oral (11/30 0135) BP: 161/65 (11/30 0135) Pulse Rate: 84 (11/30 0135)  Labs: Recent Labs    04/13/17 1550  HGB 13.4  HCT 40.9  PLT 227  CREATININE 0.67  TROPONINI <0.03    CrCl cannot be calculated (Unknown ideal weight.).   Medical History: Past Medical History:  Diagnosis Date  . Arthritis   . Depression   . Essential hypertension   . Glaucoma   . Hearing loss   . Mild dementia   . Neuropathy   . Ovarian cancer ([place])    remote    Medications:  Scheduled:  . amLODipine  5 mg Oral Daily  . aspirin EC  81 mg Oral Daily  . citalopram  20 mg Oral Daily  . gabapentin  100 mg Oral Daily  . gabapentin  200 mg Oral QHS  . irbesartan  300 mg Oral Daily  . latanoprost  1 drop Both Eyes QHS  . loratadine  10 mg Oral Daily  . memantine  10 mg Oral BID  . mirtazapine  15 mg Oral QHS  . Netarsudil Dimesylate  1 drop Ophthalmic QHS   Infusions:    Assessment: 80 yoF c/o abdominal pain found to have right femoral vein DVT. Lovenox per Rx.  Goal of Therapy:  Anti-Xa level 0.6-1 units/ml 4hrs after LMWH dose given    Plan:  Ht and Wt STAT Lovenox 60 mg sq q12h F/u scr  Lawana Pai R 04/14/2017,2:00 AM

## 2017-04-14 NOTE — Care Management Note (Signed)
Case Management Note  Patient Details  Name: TRISTINE LANGI MRN: [id number] Date of Birth: [birth year]  Subjective/Objective:                  Abd. pain  Action/Plan: [facility] is a patient of [facility] and diease management is through the [facility].  Expected Discharge Date:                  Expected Discharge Plan:  Home/Self Care  In-House Referral:     Discharge planning Services  CM Consult, Other - See comment  Post Acute Care Choice:    Choice offered to:     DME Arranged:    DME Agency:     HH Arranged:  RN, Disease Management [place] Agency:  Other - See comment  Status of Service:  In process, will continue to follow  If discussed at Long Length of Stay Meetings, dates discussed:    Additional Comments:  Leeroy Cha, RN 04/14/2017, 10:41 AM

## 2017-04-14 NOTE — Progress Notes (Signed)
Date: April 14, 2017 Chart review for discharge needs:  None found for case management. Patient has no questions concerning post hospital care.

## 2017-04-14 NOTE — Progress Notes (Signed)
Initial Nutrition Assessment  DOCUMENTATION CODES:   Not applicable  INTERVENTION:   Ensure Enlive po BID, each supplement provides 350 kcal and 20 grams of protein  NUTRITION DIAGNOSIS:   Inadequate oral intake related to other (see comment)(new onset abominal pain) as evidenced by per patient/family report.  GOAL:   Patient will meet greater than or equal to 90% of their needs   MONITOR:   PO intake, Supplement acceptance, Labs, Weight trends  REASON FOR ASSESSMENT:   Malnutrition Screening Tool    ASSESSMENT:   Pt with PMH significant for dementia, glaucoma, and HTN. Presents this admission with complaints of right sided abdominal pain that started yesterday. Admitted for midepigastric pain leading to diffuse abdominal pain with SOB upon movement.    Spoke with pt at bedside.  Pt reports having normal appetite up until lunch yesterday when she began to experience worsening abdominal pain which lead to decrease appetite for dinner and today. States prior to yesterday she ate like a "pig". Typically consumes two meals per day that consist of "a breakfast cookie with coffee for breakfast and chicken, carrots, and sweet potato pie for dinner." Pt denies using supplementation at [facility]. Amendable to Ensure this hospital stay. Pt denies any recent weight loss with a UBW of 110 lb. Records are limited in weight history.Do not suspect any recent significant weight loss. Nutrition-Focused physical exam completed.   Medications reviewed and include: Remeron Labs reviewed: K 3.4 (L)   NUTRITION - FOCUSED PHYSICAL EXAM:    Most Recent Value  Orbital Region  No depletion  Upper Arm Region  No depletion  Thoracic and Lumbar Region  Unable to assess  Buccal Region  Mild depletion  Temple Region  Mild depletion  Clavicle Bone Region  Mild depletion  Clavicle and Acromion Bone Region  Mild depletion  Scapular Bone Region  Unable to assess  Dorsal Hand  Mild depletion  Patellar Region   No depletion  Anterior Thigh Region  No depletion  Posterior Calf Region  No depletion  Edema (RD Assessment)  Mild  Hair  Reviewed  Eyes  Reviewed  Mouth  Reviewed  Skin  Reviewed  Nails  Reviewed       Diet Order:  Diet Heart Room service appropriate? Yes; Fluid consistency: Thin  EDUCATION NEEDS:   Not appropriate for education at this time  Skin:  Skin Assessment: Reviewed RN Assessment  Last BM:  04/12/17  Height:   Ht Readings from Last 1 Encounters:  04/14/17 5\' 5"  (1.651 m)    Weight:   Wt Readings from Last 1 Encounters:  04/14/17 134 lb 4.2 oz (60.9 kg)    Ideal Body Weight:  56.8 kg  BMI:  Body mass index is 22.34 kg/m.  Estimated Nutritional Needs:   Kcal:  1600-1800 kcal/day  Protein:  70-80 g/day  Fluid:  >1.6 L/day    Mariana Single RD, LDN Clinical Nutrition Pager # - [phone]

## 2017-04-14 NOTE — Discharge Summary (Signed)
Discharge Summary  Karen Thompson VZD:[id number] DOB: August 30, 1920  PCP: System, Provider Not In  Admit date: 04/13/2017 Discharge date: 04/14/2017  Time spent: >33mins, more than 50% time spent on coordination of care  Recommendations for Outpatient Follow-up:  1. F/u with PMD within a week  for hospital discharge follow up, repeat cbc/bmp at follow up  Discharge Diagnoses:  Active Hospital Problems   Diagnosis Date Noted  . Abdominal pain 04/13/2017  . Chest pain 04/13/2017  . Dvt femoral (deep venous thrombosis) ([place]) 04/13/2017  . Essential hypertension 04/13/2017  . Dementia 04/13/2017  . Liver cyst 04/13/2017    Resolved Hospital Problems  No resolved problems to display.    Discharge Condition: stable  Diet recommendation: heart healthy  Filed Weights   04/14/17 0200  Weight: 60.9 kg (134 lb 4.2 oz)    History of present illness: (per admitting MD Dr Lorin Mercy) PCP:  Mikael Spray, [facility] Consultants:  None Patient coming from: Home - lives with granddaughter; NOK: Granddaughter, [phone]  Chief Complaint: abdominal pain  HPI: Karen Thompson is a 83 y.o. female with medical history significant of dementia; glaucoma; HTN; and depression presenting with abdominal pain.  Last night, she developed pain in her right side, thought it was gas.  This AM, she was SOB, breathing fast, and complained about the pain.   She started eating lunch and the pain started getting worse - substernal pain along her mid-abdomen and radiated to the right side.  She began feeling SOB again and laid her head down. She was at [facility] so they did vitals (elevated BP) and EKG (okay).  She rested but still wasn't better and so they sent her here.    [facility] was concerned that it was chest pain; the patient points to epigastric region.  No fevers.  Able to eat/drink.  No N/V/D/C.  Normal O2 sats in the ER (?low at [facility]). Also with R posterior leg pain. Right leg pain has been for about a  week, periodically before that as well.  It has been swelling routinely.  She had a car trip to [place] for Thanksgiving.  ED Course: Complaining of chest and abdominal pain - more epigastric, abdominal.     Labs unremarkable.  CT with liver lesions - ?cysts; mildly distended gallbladder; also with DVT in R leg - has some mild pain and swelling.  Concern for Gastritis vs. Cholecystitis.     Hospital Course:  Principal Problem:   Abdominal pain Active Problems:   Chest pain   Dvt femoral (deep venous thrombosis) ([facility])   Essential hypertension   Dementia   Liver cyst   right common femoral and femoral vein consistent with DVT, nonocclusive Patient did report right sided leg pain and edema for the last two day, she denies chest pain, no sob, troponin negative She is treated with lovenox in the hospital Right lower extremity edema has improved, right sided pain has improved She is discharged on apixaban , will need at least 3-6 months treatment as long as patient is able to tolerate the meds. Patient is to follow up with [facility] for ongoing care   HTN -Takes Norvasc and Benicar at home  Liver cysts -Patient noted to have multiple probable simple liver cysts on CT -No further f/u is likely needed at this time given the patient's age and the generally benign appearance on CT  Dementia -Continue Namenda -Also with depression, on Celexa and Remeron  Code Status:  DNR -  confirmed with patient/family Family Communication: Granddaughter present throughout evaluation  Disposition Plan:  Home , and follow with [facility] program     Procedures:  none  Consultations:  none  Discharge Exam: BP (!) 169/71 (BP Location: Right Arm)   Pulse 75   Temp 98.2 F (36.8 C) (Oral)   Resp 16   Ht 5\' 5"  (1.651 m) Comment: per pt  Wt 60.9 kg (134 lb 4.2 oz)   SpO2 93%   BMI 22.34 kg/m   General: frail elderly, NAD, oriented to year, place and person Cardiovascular:  RRR Respiratory: CTABL Abdomen: soft, nontender, +bs Extremity: right lower extremity slightly edematous  Discharge Instructions You were cared for by a hospitalist during your hospital stay. If you have any questions about your discharge medications or the care you received while you were in the hospital after you are discharged, you can call the unit and asked to speak with the hospitalist on call if the hospitalist that took care of you is not available. Once you are discharged, your primary care physician will handle any further medical issues. Please note that NO REFILLS for any discharge medications will be authorized once you are discharged, as it is imperative that you return to your primary care physician (or establish a relationship with a primary care physician if you do not have one) for your aftercare needs so that they can reassess your need for medications and monitor your lab values.   Allergies as of 04/14/2017      Reactions   Lactose Intolerance (gi)    Penicillins    Has patient had a PCN reaction causing immediate rash, facial/tongue/throat swelling, SOB or lightheadedness with hypotension: Y Has patient had a PCN reaction causing severe rash involving mucus membranes or skin necrosis: N Has patient had a PCN reaction that required hospitalization: N Has patient had a PCN reaction occurring within the last 10 years: N If all of the above answers are "NO", then may proceed with Cephalosporin use.   Tomato Itching, Rash   Raw tomatoes      Medication List    TAKE these medications   amLODipine 5 MG tablet Commonly known as:  NORVASC Take 5 mg by mouth daily.   BIOFREEZE 4 % Gel Generic drug:  Menthol (Topical Analgesic) Apply 1 application topically 2 (two) times daily as needed (pain).   cholecalciferol 1000 units tablet Commonly known as:  VITAMIN D Take 1,000 Units by mouth daily.   cholestyramine light 4 GM/DOSE powder Commonly known as:  PREVALITE Take 4  g by mouth daily as needed (diarrhea).   citalopram 20 MG tablet Commonly known as:  CELEXA Take 20 mg by mouth daily.   fexofenadine 180 MG tablet Commonly known as:  ALLEGRA Take 180 mg by mouth daily.   gabapentin 100 MG capsule Commonly known as:  NEURONTIN Take 100-200 mg by mouth 2 (two) times daily. Take 1 capsule in the am and Take 2 capsules in the afternoon.   memantine 10 MG tablet Commonly known as:  NAMENDA Take 10 mg by mouth 2 (two) times daily.   mirtazapine 15 MG tablet Commonly known as:  REMERON Take 15 mg by mouth at bedtime.   olmesartan 40 MG tablet Commonly known as:  BENICAR Take 40 mg by mouth daily.   RHOPRESSA 0.02 % Soln Generic drug:  Netarsudil Dimesylate Apply 1 drop to eye at bedtime.   SALONPAS EX Apply 1 patch topically 3 (three) times daily as needed (pain).  SIMBRINZA 1-0.2 % Susp Generic drug:  Brinzolamide-Brimonidine Apply 1 drop to eye 2 (two) times daily.   travoprost (benzalkonium) 0.004 % ophthalmic solution Commonly known as:  TRAVATAN Place 1 drop into both eyes at bedtime.   TYLENOL 8 HOUR 650 MG CR tablet Generic drug:  acetaminophen Take 650 mg by mouth every 8 (eight) hours as needed for pain.      Allergies  Allergen Reactions  . Lactose Intolerance (Gi)   . Penicillins     Has patient had a PCN reaction causing immediate rash, facial/tongue/throat swelling, SOB or lightheadedness with hypotension: Y Has patient had a PCN reaction causing severe rash involving mucus membranes or skin necrosis: N Has patient had a PCN reaction that required hospitalization: N Has patient had a PCN reaction occurring within the last 10 years: N If all of the above answers are "NO", then may proceed with Cephalosporin use.   . Tomato Itching and Rash    Raw tomatoes   Follow-up Information    follow [facility] with [facility] Follow up.            The results of significant diagnostics from this hospitalization (including  imaging, microbiology, ancillary and laboratory) are listed below for reference.    Significant Diagnostic Studies: Dg Chest 2 View  Result Date: 04/13/2017 CLINICAL DATA:  Chest pain and cough for 2 days. EXAM: CHEST  2 VIEW COMPARISON:  03/21/2016 FINDINGS: AP and lateral views of the chest show low volumes. Bibasilar airspace disease is evident. Cardiopericardial silhouette is at upper limits of normal for size. Bones are diffusely demineralized. Telemetry leads overlie the chest. IMPRESSION: Bibasilar airspace disease. This may be atelectatic although pneumonia is not excluded. Electronically Signed   By: Misty Stanley M.D.   On: 04/13/2017 15:58   Ct Abdomen Pelvis W Contrast  Result Date: 04/13/2017 CLINICAL DATA:  Abdominal distension.  Right upper quadrant pain. EXAM: CT ABDOMEN AND PELVIS WITH CONTRAST TECHNIQUE: Multidetector CT imaging of the abdomen and pelvis was performed using the standard protocol following bolus administration of intravenous contrast. CONTRAST:  110mL ISOVUE-300 IOPAMIDOL (ISOVUE-300) INJECTION 61% COMPARISON:  Chest radiograph today. FINDINGS: Lower chest: Breathing motion artifact limits assessment. Patchy bibasilar opacities, right greater than left. Heart size upper normal. Aortic atherosclerosis. Hepatobiliary: Motion artifact the liver. Multiple low-density hepatic lesions throughout both lobes. Largest lesion in the left lobe measures 2.7 cm. Many of these lesions represent simple cysts, however some are intermediate density, for example image 20 series 2 in the right and left lobes. Simple fluid density is confirmed in some of these lesion on delayed phase imaging, and it is unclear whether the intermediate density is secondary to volume averaging secondary to motion or true non cystic nature of these lesions. Gallbladder is partially distended. No calcified gallstone. Prominent distal common bile duct 8 mm distally. Pancreas: Motion artifact. Parenchymal atrophy.  No evidence peripancreatic inflammation. No definite ductal dilatation. Spleen: Motion artifacts.  Normal in size. Adrenals/Urinary Tract: No definite adrenal nodule, motion limited exam. No hydronephrosis or perinephric edema. Partially duplicated left renal collecting system. Multiple bilateral renal cysts, largest on the left kidney anteriorly midportion measuring 3.8 cm. Stomach/Bowel: Stomach physiologically distended. No evidence of bowel inflammation, wall thickening or obstruction. Descending and sigmoid diverticulosis without diverticulitis. Moderate colonic stool burden. The appendix is not visualized, no pericecal or right lower quadrant inflammation. Vascular/Lymphatic: Low-density filling defect in the right common femoral and included femoral vein consistent with DVT. Moderate to advanced aortic atherosclerosis and  tortuosity. Advanced bi-iliac atherosclerosis with at least high-grade stenosis of the left external iliac artery, distal flow is noted. Examination not tailored for arterial evaluation. No aneurysm. Motion limits assessment for adenopathy, no bulky adenopathy is seen. Reproductive: Normal for age atrophic uterus. No definite adnexal mass, fluid-filled bowel in the pelvis partially limited adnexal assessment. Other: No ascites or free air.  No intra-abdominal abscess. Musculoskeletal: Moderate L2 compression fracture, likely remote. Multilevel degenerative change throughout spine. The bones are under mineralized. Bilateral iliac bone heterogeneity without defined lesion. IMPRESSION: 1. Hypodense hepatic lesions, which represent simple cysts. Some of these lesions may measure slightly higher than simple fluid density, however there is significant motion artifact through the liver and this may be secondary to volume averaging. MRI is the preferred method for characterization, however given patient's inability to breath hold would be nondiagnostic in this case. 2. Filling defect within the  right common femoral and femoral vein consistent with DVT, nonocclusive. 3. Colonic diverticulosis without diverticulitis. Moderate colonic stool burden can be seen with constipation, no fecal impaction or bowel obstruction. 4. Aorta bi-iliac atherosclerosis, moderate to advanced. 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 5. L2 compression fracture is likely remote. These results were called by telephone at the time of interpretation on 04/13/2017 at 9:25 pm to Dr. Milton Ferguson , who verbally acknowledged these results. Electronically Signed   By: Jeb Levering M.D.   On: 04/13/2017 21:25    Microbiology: No results found for this or any previous visit (from the past 240 hour(s)).   Labs: Basic Metabolic Panel: Recent Labs  Lab 04/13/17 1550  NA 141  K 3.4*  CL 104  CO2 28  GLUCOSE 114*  BUN 12  CREATININE 0.67  CALCIUM 10.9*   Liver Function Tests: Recent Labs  Lab 04/13/17 1550  AST 22  ALT 14  ALKPHOS 81  BILITOT 0.5  PROT 8.2*  ALBUMIN 3.8   Recent Labs  Lab 04/13/17 1550  LIPASE 22   No results for input(s): AMMONIA in the last 168 hours. CBC: Recent Labs  Lab 04/13/17 1550  WBC 10.9*  HGB 13.4  HCT 40.9  MCV 87.2  PLT 227   Cardiac Enzymes: Recent Labs  Lab 04/13/17 1550 04/14/17 0135 04/14/17 0650  TROPONINI <0.03 <0.03 <0.03   BNP: BNP (last 3 results) No results for input(s): BNP in the last 8760 hours.  ProBNP (last 3 results) No results for input(s): PROBNP in the last 8760 hours.  CBG: No results for input(s): GLUCAP in the last 168 hours.     Signed:  Florencia Reasons MD, PhD  [facility] 04/14/2017, 12:05 PM

## 2017-04-14 NOTE — Progress Notes (Signed)
ANTICOAGULATION CONSULT NOTE - Initial Consult  Pharmacy Consult for Lovenox>apixaban Indication: DVT (right femoral vein)  Allergies  Allergen Reactions  . Lactose Intolerance (Gi)   . Penicillins     Has patient had a PCN reaction causing immediate rash, facial/tongue/throat swelling, SOB or lightheadedness with hypotension: Y Has patient had a PCN reaction causing severe rash involving mucus membranes or skin necrosis: N Has patient had a PCN reaction that required hospitalization: N Has patient had a PCN reaction occurring within the last 10 years: N If all of the above answers are "NO", then may proceed with Cephalosporin use.   . Tomato Itching and Rash    Raw tomatoes     Height: 5\' 5"  (165.1 cm)(per pt) Weight: 134 lb 4.2 oz (60.9 kg) IBW/kg (Calculated) : 57  Vital Signs: Temp: 98.2 F (36.8 C) (11/30 1000) Temp Source: Oral (11/30 1000) BP: 169/71 (11/30 1000) Pulse Rate: 75 (11/30 1000)  Labs: Recent Labs    04/13/17 1550 04/14/17 0135 04/14/17 0650  HGB 13.4  --   --   HCT 40.9  --   --   PLT 227  --   --   CREATININE 0.67  --   --   TROPONINI <0.03 <0.03 <0.03    Estimated Creatinine Clearance: 38.7 mL/min (by C-G formula based on SCr of 0.67 mg/dL).   Medical History: Past Medical History:  Diagnosis Date  . Arthritis   . Depression   . Essential hypertension   . Glaucoma   . Hearing loss   . Mild dementia   . Neuropathy   . Ovarian cancer ([place])    remote    Medications:  Scheduled:  . amLODipine  5 mg Oral Daily  . aspirin EC  81 mg Oral Daily  . chlorhexidine  15 mL Mouth Rinse BID  . citalopram  20 mg Oral Daily  . enoxaparin (LOVENOX) injection  60 mg Subcutaneous Q12H  . feeding supplement (ENSURE ENLIVE)  237 mL Oral BID BM  . gabapentin  100 mg Oral Daily  . gabapentin  200 mg Oral QHS  . irbesartan  300 mg Oral Daily  . latanoprost  1 drop Both Eyes QHS  . loratadine  10 mg Oral Daily  . mouth rinse  15 mL Mouth Rinse  [id number]  . memantine  10 mg Oral BID  . mirtazapine  15 mg Oral QHS  . Netarsudil Dimesylate  1 drop Ophthalmic QHS   Infusions:    Assessment: 66 yoF c/o abdominal pain found to have right femoral vein DVT. Pt received one dose of therapeutic lovenox, now transitioning to eliquis per pharmacy  04/14/2017 Scr 0.64, CrCl ~ 38.7 mls/min .  Goal of Therapy:  eliquis per renal function and indication   Plan:  eliquis 10mg  po twice daily for 7 days then eliquis 5mg  twice daily thereafter Pharmacy to provide education Monitor renal function and s/s of bleed  Dolly Rias RPh 04/14/2017, 12:17 PM Pager [phone]

## 2017-12-20 ENCOUNTER — Emergency Department (HOSPITAL_BASED_OUTPATIENT_CLINIC_OR_DEPARTMENT_OTHER): Payer: Medicare (Managed Care)

## 2017-12-20 ENCOUNTER — Ambulatory Visit (HOSPITAL_COMMUNITY): Payer: Medicare (Managed Care)

## 2017-12-20 ENCOUNTER — Other Ambulatory Visit: Payer: Self-pay

## 2017-12-20 ENCOUNTER — Emergency Department (HOSPITAL_COMMUNITY)
Admission: EM | Admit: 2017-12-20 | Discharge: 2017-12-20 | Disposition: A | Discharge status: Home / Self Care | Payer: Medicare (Managed Care) | Attending: Emergency Medicine | Admitting: Emergency Medicine

## 2017-12-20 DIAGNOSIS — I82402 Acute embolism and thrombosis of unspecified deep veins of left lower extremity: Secondary | ICD-10-CM | POA: Insufficient documentation

## 2017-12-20 DIAGNOSIS — F0390 Unspecified dementia without behavioral disturbance: Secondary | ICD-10-CM | POA: Diagnosis not present

## 2017-12-20 DIAGNOSIS — Z79899 Other long term (current) drug therapy: Secondary | ICD-10-CM | POA: Diagnosis not present

## 2017-12-20 DIAGNOSIS — M79609 Pain in unspecified limb: Secondary | ICD-10-CM | POA: Diagnosis not present

## 2017-12-20 DIAGNOSIS — M7989 Other specified soft tissue disorders: Secondary | ICD-10-CM

## 2017-12-20 DIAGNOSIS — I10 Essential (primary) hypertension: Secondary | ICD-10-CM | POA: Diagnosis not present

## 2017-12-20 DIAGNOSIS — F329 Major depressive disorder, single episode, unspecified: Secondary | ICD-10-CM | POA: Insufficient documentation

## 2017-12-20 DIAGNOSIS — Z8543 Personal history of malignant neoplasm of ovary: Secondary | ICD-10-CM | POA: Insufficient documentation

## 2017-12-20 DIAGNOSIS — Z7901 Long term (current) use of anticoagulants: Secondary | ICD-10-CM | POA: Insufficient documentation

## 2017-12-20 DIAGNOSIS — M79605 Pain in left leg: Secondary | ICD-10-CM | POA: Diagnosis present

## 2017-12-20 LAB — BASIC METABOLIC PANEL
Anion gap: 9 (ref 5–15)
BUN: 9 mg/dL (ref 8–23)
CO2: 25 mmol/L (ref 22–32)
Calcium: 10.7 mg/dL — ABNORMAL HIGH (ref 8.9–10.3)
Chloride: 109 mmol/L (ref 98–111)
Creatinine, Ser: 0.67 mg/dL (ref 0.44–1.00)
GFR calc Af Amer: >60 mL/min (ref >60)
GFR calc non Af Amer: >60 mL/min (ref >60)
Glucose, Bld: 121 mg/dL — ABNORMAL HIGH (ref 70–99)
Potassium: 3.6 mmol/L (ref 3.5–5.1)
Sodium: 143 mmol/L (ref 135–145)

## 2017-12-20 LAB — CBC WITH DIFFERENTIAL/PLATELET
Abs Immature Granulocytes: 0 10*3/uL (ref 0.0–0.1)
Basophils Absolute: 0 10*3/uL (ref 0.0–0.1)
Basophils Relative: 0 %
Eosinophils Absolute: 0 10*3/uL (ref 0.0–0.7)
Eosinophils Relative: 0 %
HCT: 39.3 % (ref 36.0–46.0)
Hemoglobin: 11.8 g/dL — ABNORMAL LOW (ref 12.0–15.0)
Immature Granulocytes: 0 %
Lymphocytes Relative: 26 %
Lymphs Abs: 1.3 10*3/uL (ref 0.7–4.0)
MCH: 27.5 pg (ref 26.0–34.0)
MCHC: 30 g/dL (ref 30.0–36.0)
MCV: 91.6 fL (ref 78.0–100.0)
Monocytes Absolute: 0.6 10*3/uL (ref 0.1–1.0)
Monocytes Relative: 11 %
Neutro Abs: 3.2 10*3/uL (ref 1.7–7.7)
Neutrophils Relative %: 63 %
Platelets: 222 10*3/uL (ref 150–400)
RBC: 4.29 MIL/uL (ref 3.87–5.11)
RDW: 15 % (ref 11.5–15.5)
WBC: 5.2 10*3/uL (ref 4.0–10.5)

## 2017-12-20 LAB — PROTIME-INR
INR: 0.95
PROTHROMBIN TIME: 12.6 s (ref 11.4–15.2)

## 2017-12-20 MED ORDER — APIXABAN 5 MG PO TABS
5.0000 mg | ORAL_TABLET | Freq: Once | ORAL | Status: DC
  Filled 2017-12-20 (×2): qty 1

## 2017-12-20 MED ORDER — APIXABAN 5 MG PO TABS: 5.0000 mg | ORAL_TABLET | Freq: Two times a day (BID) | ORAL | 0 refills | Status: AC

## 2017-12-20 NOTE — ED Notes (Signed)
Pt normal heart rate is in 50's

## 2017-12-20 NOTE — ED Provider Notes (Signed)
[place] EMERGENCY DEPARTMENT Provider Note   CSN: [id number] Arrival date & time: 12/20/17  1108     History   Chief Complaint Chief Complaint  Patient presents with  . Foot Pain    HPI Karen Thompson is a 84 y.o. female.  84 year old female with history of dementia resident at [facility].  She has a prior history of a right DVT and was on 6 months of anticoagulation that is now stopped.  She is had 3 months of left lower extremity pain worse over the past 3 weeks.  She was seen by the facility doctor today for this pain and they were unable to get a Doppler signal and was sent here for possible ischemic limb.  Patient's granddaughter is here and is giving most of the history.  She is had new ankle swelling over the past month or so and some discoloration at the bottom of her foot.  She is still ambulatory but complains of pain with any type of palpation of her ankle and foot.  The pain seems worse with contact or ambulation but still has rest pain.  No reported trauma.  In asking the patient she has pain from her groin down to her feet.  She does have known DJD and spinal stenosis.  Level 5 caveat secondary to dementia. The history is provided by the patient and a relative.  Foot Pain  This is a new problem. The current episode started more than 1 week ago. The problem occurs constantly. The problem has not changed since onset.Pertinent negatives include no chest pain, no abdominal pain, no headaches and no shortness of breath. The symptoms are aggravated by walking. Nothing relieves the symptoms. She has tried nothing for the symptoms. The treatment provided no relief.    Past Medical History:  Diagnosis Date  . Arthritis   . Depression   . Essential hypertension   . Glaucoma   . Hearing loss   . Mild dementia   . Neuropathy   . Ovarian cancer [facility])    remote    Patient Active Problem List   Diagnosis Date Noted  . Abdominal pain 04/13/2017  . Chest pain  04/13/2017  . Dvt femoral (deep venous thrombosis) ([place]) 04/13/2017  . Essential hypertension 04/13/2017  . Dementia 04/13/2017  . Liver cyst 04/13/2017    Past Surgical History:  Procedure Laterality Date  . CATARACT EXTRACTION, BILATERAL    . PARTIAL HYSTERECTOMY    . SPINE SURGERY       OB History   None      Home Medications    Prior to Admission medications   Medication Sig Start Date End Date Taking? Authorizing Provider  acetaminophen (TYLENOL 8 HOUR) 650 MG CR tablet Take 650 mg by mouth every 8 (eight) hours as needed for pain.    [provider]  amLODipine (NORVASC) 5 MG tablet Take 5 mg by mouth daily.    [provider]  apixaban (ELIQUIS) 5 MG TABS tablet Take 2 tablets (10 mg total) by mouth 2 (two) times daily for 7 days. 04/14/17 04/21/17  Florencia Reasons, MD  apixaban (ELIQUIS) 5 MG TABS tablet Take 1 tablet (5 mg total) by mouth 2 (two) times daily. 04/21/17   Florencia Reasons, MD  Brinzolamide-Brimonidine [facility]) 1-0.2 % SUSP Apply 1 drop to eye 2 (two) times daily.    [provider]  cholecalciferol (VITAMIN D) 1000 units tablet Take 1,000 Units by mouth daily.    [provider]  cholestyramine light (PREVALITE) 4 GM/DOSE powder Take 4 g by mouth daily as needed (diarrhea).    [provider]  citalopram (CELEXA) 20 MG tablet Take 20 mg by mouth daily.    [provider]  fexofenadine (ALLEGRA) 180 MG tablet Take 180 mg by mouth daily.    [provider]  gabapentin (NEURONTIN) 100 MG capsule Take 100-200 mg by mouth 2 (two) times daily. Take 1 capsule in the am and Take 2 capsules in the afternoon.    [provider]  Liniments (SALONPAS EX) Apply 1 patch topically 3 (three) times daily as needed (pain).    [provider]  memantine (NAMENDA) 10 MG tablet Take 10 mg by mouth 2 (two) times daily.    [provider]  Menthol, Topical Analgesic, (BIOFREEZE) 4 % GEL Apply 1  application topically 2 (two) times daily as needed (pain).    [provider]  mirtazapine (REMERON) 15 MG tablet Take 15 mg by mouth at bedtime.    [provider]  Netarsudil Dimesylate (RHOPRESSA) 0.02 % SOLN Apply 1 drop to eye at bedtime.    [provider]  olmesartan (BENICAR) 40 MG tablet Take 40 mg by mouth daily.    [provider]  travoprost, benzalkonium, (TRAVATAN) 0.004 % ophthalmic solution Place 1 drop into both eyes at bedtime.    [provider]    Family History No family history on file.  Social History Social History   Tobacco Use  . Smoking status: Never Smoker  . Smokeless tobacco: Current User    Types: Snuff  Substance Use Topics  . Alcohol use: Yes    Frequency: Never    Comment: occasional wine  . Drug use: No     Allergies   Lactose intolerance (gi); Penicillins; and Tomato   Review of Systems Review of Systems  Constitutional: Negative for fever.  HENT: Negative for sore throat.   Eyes: Negative for visual disturbance.  Respiratory: Negative for cough and shortness of breath.   Cardiovascular: Positive for leg swelling. Negative for chest pain.  Gastrointestinal: Negative for abdominal pain.  Genitourinary: Negative for dysuria.  Musculoskeletal: Positive for back pain.  Skin: Negative for rash.  Neurological: Negative for headaches.     Physical Exam Updated Vital Signs BP 139/60 (BP Location: Right Arm)   Pulse (!) 56   Temp 97.6 F (36.4 C) (Oral)   Resp 16   SpO2 100%   Physical Exam  Constitutional: She appears well-developed and well-nourished.  HENT:  Head: Normocephalic and atraumatic.  Eyes: Conjunctivae are normal.  Neck: Neck supple.  Cardiovascular: Normal rate, regular rhythm and normal heart sounds.  Pulmonary/Chest: Effort normal. No stridor. She has no wheezes. She has no rales.  Abdominal: Soft. She exhibits no mass. There is no tenderness. There is no guarding.    Musculoskeletal: Normal range of motion. She exhibits edema and tenderness. She exhibits no deformity.  She has nonpitting edema at both ankles.  Both of her feet are cool but not cold.  She has some delayed cap refill bilaterally.  She has nonpalpable DP and PT pulses but has Doppler signal in both feet.  She has decreased sensation in both feet and normal from her ankles proximally.  There is some darkened skin discoloration distal to the ankle, but no open wounds.  Neurological: She is alert. GCS eye subscore is 4. GCS verbal subscore is 5. GCS motor subscore is 6.  Skin: Skin  is warm and dry.  Psychiatric: She has a normal mood and affect.  Nursing note and vitals reviewed.    ED Treatments / Results  Labs (all labs ordered are listed, but only abnormal results are displayed) Labs Reviewed  CBC WITH DIFFERENTIAL/PLATELET - Abnormal; Notable for the following components:      Result Value   Hemoglobin 11.8 (*)    All other components within normal limits  BASIC METABOLIC PANEL - Abnormal; Notable for the following components:   Glucose, Bld 121 (*)    Calcium 10.7 (*)    All other components within normal limits  PROTIME-INR    EKG EKG Interpretation  Date/Time:  Wednesday December 20 2017 12:49:54 EDT Ventricular Rate:  71 PR Interval:    QRS Duration: 85 QT Interval:  439 QTC Calculation: 478 R Axis:   64 Text Interpretation:  Sinus rhythm Prolonged PR interval Borderline repolarization abnormality similar to prior 11/18 Confirmed by Aletta Edouard [phone]) on 12/20/2017 12:52:25 PM   Radiology No results found.  Procedures Procedures (including critical care time)  Medications Ordered in ED Medications - No data to display   Initial Impression / Assessment and Plan / ED Course  I have reviewed the triage vital signs and the nursing notes.  Pertinent labs & imaging results that were available during my care of the patient were reviewed by me and considered in my  medical decision making (see chart for details).  Clinical Course as of Dec 22 939  Wed Dec 20, 2017  1328 Discussed with Dr. Trula Slade from vascular surgery.  He recommends checking a venous duplex along with arterial duplex and ABIs.  He feels that her age would be unlikely that she be offered any kind of surgical procedure.  He recommends calling him back if we have any abnormal results that would require his intervention.   [MB]  1502 Received a call from vascular tech.  Patient has some DVT in her left lower extremity although she is not sure if it is subacute or chronic.  She did not want to perform an ABI because of the clot but did an arterial study and she said there is some arterial insufficiency but she does have flow down through her calf.  She feels it may be restricted more in the iliac area.   [MB]  2774 Patient and family member updated on all the results.  There is still waiting for 1 of the cardiology attendings to stop through and then likely we can start on anticoagulation and send her home.   [MB]  1932 She and her daughter were expecting vascular to stop by and see them.  I put a call into vascular and still has not received a call back.  The patient and her daughter would just like to be discharged now and asked that we start her on some blood thinner and they will arrange for an outpatient follow-up in the clinic.   [MB]    Clinical Course User Index [MB] Hayden Rasmussen, MD     Final Clinical Impressions(s) / ED Diagnoses   Final diagnoses:  Deep vein thrombosis (DVT) of left lower extremity, unspecified chronicity, unspecified vein [facility])    ED Discharge Orders         Ordered    apixaban (ELIQUIS) 5 MG TABS tablet  2 times daily     12/20/17 1934           Hayden Rasmussen, MD 12/21/17 [phone]

## 2017-12-20 NOTE — ED Triage Notes (Signed)
Pt to ER for evaluation of left foot pain, swelling, coolness, and pulseless - no pulse audible by doppler. Pt sent by PCP for vascular consult.

## 2017-12-20 NOTE — Progress Notes (Signed)
*  PRELIMINARY RESULTS* Vascular Ultrasound Left Lower Extremity Arterial Duplex has been completed. The left deep femoral and posterior tibial arteries appear to be occluded. There is no obvious evidence of hemodynamically significant stenosis involving the remaining arteries of the left lower extremity. However, given the abnormal waveforms throughout the left lower extremity, beginning at the left common femoral artery, proximal obstruction involving the left iliac artery is suspected.   Left lower extremity venous duplex completed. Left lower extremity is positive for acute versus age indeterminate deep vein thrombosis involving the left saphenofemoral junction, common femoral, femoral, popliteal, posterior tibial, peroneal veins. There is no evidence of left Baker's cyst.  Preliminary results discussed with Dr. Melina Copa.  12/20/2017 3:25 PM  Maudry Mayhew, BS, RVT, RDCS, RDMS

## 2017-12-20 NOTE — ED Notes (Signed)
Discharge instructions discussed with Pt. Pt verbalized understanding. Pt stable and leaving via WC.    

## 2017-12-20 NOTE — Discharge Instructions (Addendum)
You were evaluated in the emergency department for increased left leg pain and swelling.  You had an ultrasound that showed signs of subacute DVT.  We are starting you on a blood thinner and I will be important for you to follow-up with vascular surgery.  We do recommend that you use TED hose on both legs.  Please return to the emergency department if any concerns.

## 2017-12-20 NOTE — ED Notes (Signed)
Pt returned from US

## 2017-12-20 NOTE — ED Notes (Signed)
Patient transported to Ultrasound 

## 2017-12-22 ENCOUNTER — Other Ambulatory Visit: Payer: Self-pay | Admitting: *Deleted

## 2017-12-22 ENCOUNTER — Institutional Professional Consult (permissible substitution) (INDEPENDENT_AMBULATORY_CARE_PROVIDER_SITE_OTHER): Payer: Medicare (Managed Care) | Admitting: Cardiothoracic Surgery

## 2017-12-22 ENCOUNTER — Encounter: Payer: Self-pay | Admitting: Cardiothoracic Surgery

## 2017-12-22 ENCOUNTER — Other Ambulatory Visit: Payer: Self-pay

## 2017-12-22 VITALS — BP 162/74 | HR 64 | Resp 16 | Ht 60.0 in | Wt 135.1 lb

## 2017-12-22 DIAGNOSIS — I82412 Acute embolism and thrombosis of left femoral vein: Secondary | ICD-10-CM

## 2017-12-22 DIAGNOSIS — M79605 Pain in left leg: Secondary | ICD-10-CM

## 2017-12-22 DIAGNOSIS — I739 Peripheral vascular disease, unspecified: Secondary | ICD-10-CM | POA: Diagnosis not present

## 2017-12-22 NOTE — Progress Notes (Signed)
PCP is System, Provider Not In Referring Provider is Lacretia Leigh, MD  Chief Complaint  Patient presents with  . New Patient (Initial Visit)    for LEFT DVT per vascular study 12/20/17    HPI: Patient examined, results of vascular studies of lower extremities performed in the ED earlier this week personally reviewed and discussed with patient and daughter.  84 year old hypertensive female, nondiabetic presents for evaluation of pain and swelling of her left lower extremity.  In November 2018 she was diagnosed with DVT of the right lower extremity and treated with 6 months of Eliquis.  The problem and symptoms on the right leg resolved.  More recently she has been having rest pain, worse at night of the left lower extremity.  It is painful for her to bear weight on the left foot.  She had arterial Doppler studies [no ABI] in the ED earlier this week which showed probable iliac inflow occlusion to the left lower extremity with decreased flow but flow present through the calf.  Arterial inflow on the right was only minimally reduced.  Venous duplex of the legs showed resolution of the right leg DVT but recent DVT of the left deep femoral vein, popliteal vein and peroneal vein.  Patient was seen at her chronic care clinic and placed on Eliquis 5 mg twice daily.  Today on exam the patient is comfortable sitting on the exam table.  Both feet are warm but pedal pulses are not palpable bilaterally.  There is a strong right femoral pulse but no palpable left femoral pulse.  Skin on both feet is intact.  The left leg he has mild 2+ edema.  Patient is not a surgical candidate for arteriograms or stenting of the left iliac artery which would probably make things worse.  Symptomatic control of her arterial rest pain with Tylenol and positional changes.  I recommended to her daughter that she start low-dose aspirin 81 mg Monday Wednesday Friday to help with the arterial insufficiency after she establishes the  Eliquis for the DVT which she has started at 5 mg twice daily.  She has had no history of stroke.  She is not in atrial fibrillation.  She had no bleeding problems with 6 months of Eliquis use to treat her previous right leg DVT.  Past Medical History:  Diagnosis Date  . Arthritis   . Depression   . Essential hypertension   . Glaucoma   . Hearing loss   . Mild dementia   . Neuropathy   . Ovarian cancer ([place])    remote    Past Surgical History:  Procedure Laterality Date  . CATARACT EXTRACTION, BILATERAL    . PARTIAL HYSTERECTOMY    . SPINE SURGERY      History reviewed. No pertinent family history.  Social History Social History   Tobacco Use  . Smoking status: Never Smoker  . Smokeless tobacco: Current User    Types: Snuff  Substance Use Topics  . Alcohol use: Yes    Frequency: Never    Comment: occasional wine  . Drug use: No    Current Outpatient Medications  Medication Sig Dispense Refill  . acetaminophen (TYLENOL 8 HOUR) 650 MG CR tablet Take 1,300 mg by mouth 2 (two) times daily.     Marland Kitchen amLODipine (NORVASC) 5 MG tablet Take 7.5 mg by mouth daily.     Marland Kitchen apixaban (ELIQUIS) 5 MG TABS tablet Take 1 tablet (5 mg total) by mouth 2 (two) times daily. 60 tablet  0  . Brinzolamide-Brimonidine (SIMBRINZA) 1-0.2 % SUSP Place 1 drop into both eyes 2 (two) times daily.     . cholecalciferol (VITAMIN D) 1000 units tablet Take 1,000 Units by mouth daily.    . citalopram (CELEXA) 20 MG tablet Take 20 mg by mouth daily.    . fexofenadine (ALLEGRA) 180 MG tablet Take 180 mg by mouth daily.    Marland Kitchen gabapentin (NEURONTIN) 100 MG capsule Take 200-300 mg by mouth See admin instructions. Take 2 capsule in the am and Take 3 capsules in the afternoon.    . Liniments (SALONPAS PAIN RELIEF PATCH EX) Apply 1 patch topically 3 (three) times daily as needed (painful joint).    . memantine (NAMENDA) 10 MG tablet Take 10 mg by mouth 2 (two) times daily.    . Menthol, Topical Analgesic, (BIOFREEZE)  4 % GEL Apply 1 application topically 2 (two) times daily as needed (pain).    . mirtazapine (REMERON) 7.5 MG tablet Take 7.5 mg by mouth at bedtime.     Mckinley Jewel Dimesylate (RHOPRESSA) 0.02 % SOLN Place 1 drop into both eyes at bedtime.     Marland Kitchen olmesartan (BENICAR) 40 MG tablet Take 40 mg by mouth daily.    . travoprost, benzalkonium, (TRAVATAN) 0.004 % ophthalmic solution Place 1 drop into both eyes every evening.      No current facility-administered medications for this visit.     Allergies  Allergen Reactions  . Lactose Intolerance (Gi)   . Penicillins     Has patient had a PCN reaction causing immediate rash, facial/tongue/throat swelling, SOB or lightheadedness with hypotension: Y Has patient had a PCN reaction causing severe rash involving mucus membranes or skin necrosis: N Has patient had a PCN reaction that required hospitalization: N Has patient had a PCN reaction occurring within the last 10 years: N If all of the above answers are "NO", then may proceed with Cephalosporin use.   . Tomato Itching and Rash    Raw tomatoes    Review of Systems  Patient has limited mobility using a rolling walker Non-smoker Nondiabetic History of previous DVT recent diagnosis of acute left DVT Hypertension No history of CVA  BP (!) 162/74 (BP Location: Right Arm, Patient Position: Sitting, Cuff Size: Normal)   Pulse 64   Resp 16   Ht 5' (1.524 m)   Wt 135 lb 1.6 oz (61.3 kg)   SpO2 96% Comment: ON RA  BMI 26.38 kg/m  Physical Exam Elderly frail alert and comfortable AA female No JVD, carotid pulses present Heart rate regular without murmur Lungs clear No pulsatile abdominal mass Strong right femoral pulse, no palpable left femoral pulse Mild swelling of the left leg, both feet warm right greater than left No skin ulceration or significant changes of the lower extremities other than mild atrophic skin changes No focal motor deficit  Diagnostic Tests: Venous and arterial  Doppler exam studies of both leg legs reviewed  Impression: Acute left leg DVT Chronic left leg arterial insufficiency with rest pain Plan: Eliquis 5 mg twice daily for 6 months Start low-dose 81 mg aspirin Monday Wednesday Friday to help the arterial insufficiency.  Patient is not a candidate for arteriograms or surgery.  Tylenol as needed for pain.  We will see the patient back in the clinic with a repeat ultrasound of the left leg to assess progress.  Len Childs, MD [facility] [phone]

## 2017-12-25 ENCOUNTER — Other Ambulatory Visit: Payer: Self-pay | Admitting: Cardiothoracic Surgery

## 2017-12-25 ENCOUNTER — Other Ambulatory Visit: Payer: Self-pay | Admitting: *Deleted

## 2017-12-25 DIAGNOSIS — M79605 Pain in left leg: Secondary | ICD-10-CM

## 2017-12-25 DIAGNOSIS — M7989 Other specified soft tissue disorders: Secondary | ICD-10-CM

## 2018-01-01 ENCOUNTER — Ambulatory Visit
Admission: RE | Admit: 2018-01-01 | Discharge: 2018-01-01 | Disposition: A | Discharge status: Home / Self Care | Payer: Medicare (Managed Care) | Source: Ambulatory Visit | Attending: Cardiothoracic Surgery | Admitting: Cardiothoracic Surgery

## 2018-01-01 DIAGNOSIS — M7989 Other specified soft tissue disorders: Secondary | ICD-10-CM

## 2018-01-01 DIAGNOSIS — M79605 Pain in left leg: Secondary | ICD-10-CM

## 2018-01-10 ENCOUNTER — Ambulatory Visit (INDEPENDENT_AMBULATORY_CARE_PROVIDER_SITE_OTHER): Payer: Medicare (Managed Care) | Admitting: Cardiothoracic Surgery

## 2018-01-10 ENCOUNTER — Other Ambulatory Visit: Payer: Self-pay

## 2018-01-10 ENCOUNTER — Encounter: Payer: Self-pay | Admitting: Cardiothoracic Surgery

## 2018-01-10 VITALS — BP 162/70 | HR 94 | Resp 18 | Ht 60.0 in | Wt 131.4 lb

## 2018-01-10 DIAGNOSIS — I82412 Acute embolism and thrombosis of left femoral vein: Secondary | ICD-10-CM | POA: Diagnosis not present

## 2018-01-10 DIAGNOSIS — I739 Peripheral vascular disease, unspecified: Secondary | ICD-10-CM | POA: Diagnosis not present

## 2018-01-10 NOTE — Progress Notes (Signed)
PCP is System, Provider Not In Referring Provider is Lacretia Leigh, MD  Chief Complaint  Patient presents with  . Follow-up    left leg ultrasound for DVT    HPI: Routine scheduled office visit for this 84 year old female with acute on chronic left leg DVT and chronic left lower extremity arterial insufficiency with a left iliac high-grade stenosis and rest pain.  Patient was placed on Eliquis approximately 4 weeks  ago.  Her rest pain in the left leg has improved and a follow-up vascular ultrasound of the left leg shows significant improvement in the left leg DVT. The left lower leg is notably warmer today on exam.  She does have some bilateral edema.  She will have gentle support stockings placed, continue Eliquis and I will give the patient a 2-week course of Lasix 20 mg a day.  Past Medical History:  Diagnosis Date  . Arthritis   . Depression   . Essential hypertension   . Glaucoma   . Hearing loss   . Mild dementia   . Neuropathy   . Ovarian cancer ([place])    remote    Past Surgical History:  Procedure Laterality Date  . CATARACT EXTRACTION, BILATERAL    . PARTIAL HYSTERECTOMY    . SPINE SURGERY      History reviewed. No pertinent family history.  Social History Social History   Tobacco Use  . Smoking status: Never Smoker  . Smokeless tobacco: Current User    Types: Snuff  Substance Use Topics  . Alcohol use: Yes    Frequency: Never    Comment: occasional wine  . Drug use: No    Current Outpatient Medications  Medication Sig Dispense Refill  . acetaminophen (TYLENOL 8 HOUR) 650 MG CR tablet Take 1,300 mg by mouth 2 (two) times daily.     Marland Kitchen amLODipine (NORVASC) 5 MG tablet Take 7.5 mg by mouth daily.     Marland Kitchen apixaban (ELIQUIS) 5 MG TABS tablet Take 1 tablet (5 mg total) by mouth 2 (two) times daily. 60 tablet 0  . Brinzolamide-Brimonidine (SIMBRINZA) 1-0.2 % SUSP Place 1 drop into both eyes 2 (two) times daily.     . cholecalciferol (VITAMIN D) 1000 units tablet  Take 1,000 Units by mouth daily.    . citalopram (CELEXA) 20 MG tablet Take 20 mg by mouth daily.    . fexofenadine (ALLEGRA) 180 MG tablet Take 180 mg by mouth daily.    Marland Kitchen gabapentin (NEURONTIN) 100 MG capsule Take 200-300 mg by mouth See admin instructions. Take 2 capsule in the am and Take 3 capsules in the afternoon.    . Liniments (SALONPAS PAIN RELIEF PATCH EX) Apply 1 patch topically 3 (three) times daily as needed (painful joint).    . memantine (NAMENDA) 10 MG tablet Take 10 mg by mouth 2 (two) times daily.    . Menthol, Topical Analgesic, (BIOFREEZE) 4 % GEL Apply 1 application topically 2 (two) times daily as needed (pain).    . mirtazapine (REMERON) 7.5 MG tablet Take 7.5 mg by mouth at bedtime.     Mckinley Jewel Dimesylate (RHOPRESSA) 0.02 % SOLN Place 1 drop into both eyes at bedtime.     Marland Kitchen olmesartan (BENICAR) 40 MG tablet Take 40 mg by mouth daily.    . travoprost, benzalkonium, (TRAVATAN) 0.004 % ophthalmic solution Place 1 drop into both eyes every evening.      No current facility-administered medications for this visit.     Allergies  Allergen Reactions  .  Lactose Intolerance (Gi)   . Penicillins     Has patient had a PCN reaction causing immediate rash, facial/tongue/throat swelling, SOB or lightheadedness with hypotension: Y Has patient had a PCN reaction causing severe rash involving mucus membranes or skin necrosis: N Has patient had a PCN reaction that required hospitalization: N Has patient had a PCN reaction occurring within the last 10 years: N If all of the above answers are "NO", then may proceed with Cephalosporin use.   . Tomato Itching and Rash    Raw tomatoes    Review of Systems  Overall her left leg pain has improved since last visit No bleeding complications from Eliquis  BP (!) 162/70 (BP Location: Right Arm, Patient Position: Sitting, Cuff Size: Normal)   Pulse 94   Resp 18   Ht 5' (1.524 m)   Wt 131 lb 6.4 oz (59.6 kg)   SpO2 96% Comment:  RA  BMI 25.66 kg/m  Physical Exam Deaf but alert and oriented elderly female accompanied by her daughter Heart rate regular 2+ bilateral pedal edema Both feet warm, palpable pulses on the right nonpalpable pulses on the left from groin to foot Diagnostic Tests: Ultrasound report reviewed and discussed with patient and daughter  Impression: Chronic arterial insufficiency left lower extremity with acute on chronic left lower extremity DVT.  Patient has responded to Eliquis with improvement in DVT and subsequent improvement in general circulation to the leg.  Plan: Return for follow-up visit in 1 month.  Patient will need Eliquis until after the first of the year.   Len Childs, MD [facility] [phone]

## 2018-02-14 ENCOUNTER — Encounter: Payer: Medicare (Managed Care) | Admitting: Cardiothoracic Surgery

## 2018-03-07 ENCOUNTER — Other Ambulatory Visit: Payer: Self-pay

## 2018-03-07 ENCOUNTER — Ambulatory Visit (INDEPENDENT_AMBULATORY_CARE_PROVIDER_SITE_OTHER): Payer: Medicare (Managed Care) | Admitting: Cardiothoracic Surgery

## 2018-03-07 ENCOUNTER — Encounter: Payer: Self-pay | Admitting: Cardiothoracic Surgery

## 2018-03-07 VITALS — BP 162/80 | Resp 18 | Ht 60.0 in

## 2018-03-07 DIAGNOSIS — I739 Peripheral vascular disease, unspecified: Secondary | ICD-10-CM | POA: Diagnosis not present

## 2018-03-07 DIAGNOSIS — I82412 Acute embolism and thrombosis of left femoral vein: Secondary | ICD-10-CM | POA: Diagnosis not present

## 2018-03-07 NOTE — Progress Notes (Signed)
PCP is System, Provider Not In Referring Provider is Lacretia Leigh, MD  Chief Complaint  Patient presents with  . Follow-up    2 weeks after LASIX therapy    HPI: 53-month follow-up for this 84 year old female with left leg chronic DVT as well as chronic arterial insufficiency being treated symptomatically with Eliquis and 81 mg aspirin.  She is also taking Lasix 20 mg daily for lower extremity edema bilaterally.  Her rest pain is well controlled with Tylenol.  She complains mainly of discomfort along the fifth toe lateral aspect of her left foot.  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  She has had no bleeding complications from the Eliquis. Past Medical History:  Diagnosis Date  . Arthritis   . Depression   . Essential hypertension   . Glaucoma   . Hearing loss   . Mild dementia ([place])   . Neuropathy   . Ovarian cancer ([place])    remote    Past Surgical History:  Procedure Laterality Date  . CATARACT EXTRACTION, BILATERAL    . PARTIAL HYSTERECTOMY    . SPINE SURGERY      History reviewed. No pertinent family history.  Social History Social History   Tobacco Use  . Smoking status: Never Smoker  . Smokeless tobacco: Current User    Types: Snuff  Substance Use Topics  . Alcohol use: Yes    Frequency: Never    Comment: occasional wine  . Drug use: No    Current Outpatient Medications  Medication Sig Dispense Refill  . acetaminophen (TYLENOL 8 HOUR) 650 MG CR tablet Take 1,300 mg by mouth 2 (two) times daily.     Marland Kitchen amLODipine (NORVASC) 5 MG tablet Take 7.5 mg by mouth daily.     Marland Kitchen apixaban (ELIQUIS) 5 MG TABS tablet Take 1 tablet (5 mg total) by mouth 2 (two) times daily. 60 tablet 0  . aspirin EC 81 MG tablet Take 81 mg by mouth daily.    . Brinzolamide-Brimonidine (SIMBRINZA) 1-0.2 % SUSP Place 1  drop into both eyes 2 (two) times daily.     . cholecalciferol (VITAMIN D) 1000 units tablet Take 1,000 Units by mouth daily.    . fexofenadine (ALLEGRA) 180 MG tablet Take 180 mg by mouth daily.    . furosemide (LASIX) 20 MG tablet Take 20 mg by mouth daily.    Marland Kitchen gabapentin (NEURONTIN) 100 MG capsule Take 200-300 mg by mouth See admin instructions. Take 2 capsule in the am and Take 3 capsules at night    . Liniments (SALONPAS PAIN RELIEF PATCH EX) Apply 1 patch topically 3 (three) times daily as needed (painful joint).    . memantine (NAMENDA) 10 MG tablet Take 10 mg by mouth 2 (two) times daily.    . Menthol, Topical Analgesic, (BIOFREEZE) 4 % GEL Apply 1 application topically 2 (two) times daily as needed (pain).    . mirtazapine (REMERON) 7.5 MG tablet Take 7.5 mg by mouth at bedtime.     . Netarsudil-Latanoprost (ROCKLATAN) 0.02-0.005 % SOLN Apply 1 drop to eye at bedtime. BOTH EYES    . olmesartan (BENICAR) 40 MG tablet Take 40 mg by mouth daily.     No current facility-administered medications for this visit.     Allergies  Allergen Reactions  . Lactose Intolerance (Gi)   .  Penicillins     Has patient had a PCN reaction causing immediate rash, facial/tongue/throat swelling, SOB or lightheadedness with hypotension: Y Has patient had a PCN reaction causing severe rash involving mucus membranes or skin necrosis: N Has patient had a PCN reaction that required hospitalization: N Has patient had a PCN reaction occurring within the last 10 years: N If all of the above answers are "NO", then may proceed with Cephalosporin use.   . Tomato Itching and Rash    Raw tomatoes    Review of Systems  Receives physical therapy at her daycare center No falls No palpitation or chest pain  BP (!) 162/80 (BP Location: Left Arm, Patient Position: Sitting, Cuff Size: Normal) Comment (Cuff Size): manually  Resp 18   Ht 5' (1.524 m)   SpO2 95% Comment: RA  BMI 25.66 kg/m  Physical Exam Elderly  AA female no acute distress accompanied by daughter Heart rate regular without murmur Lungs clear Nonpalpable pedal pulses in each foot with mild edema bilaterally.  Feet are luke warm to touch Diagnostic Tests: None  Impression: Venous and arterial insufficiency left lower extremity  Plan continue Eliquis and aspirin for comfort care: No indication for invasive diagnostics or procedures. Return for evaluation in 3 months Len Childs, MD [facility] [phone]

## 2018-06-12 ENCOUNTER — Ambulatory Visit: Payer: Medicare (Managed Care) | Admitting: Cardiothoracic Surgery

## 2018-06-13 ENCOUNTER — Ambulatory Visit: Payer: Medicare (Managed Care) | Admitting: Cardiothoracic Surgery

## 2018-12-28 IMAGING — US US EXTREM LOW VENOUS*L*
1 series · 13 of 24 positions shown · non-contrast
Comparison: None.

CLINICAL DATA: [AGE] female with a history of left lower
extremity DVT.



[Series 1: us extrem low venous*left* · 0.06mm/px · 13 of 36 slices shown]
[im 1/36]
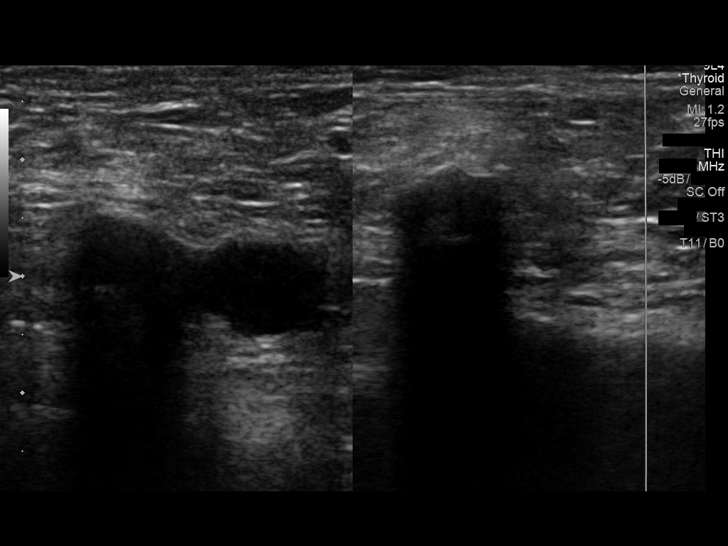
[im 4/36]
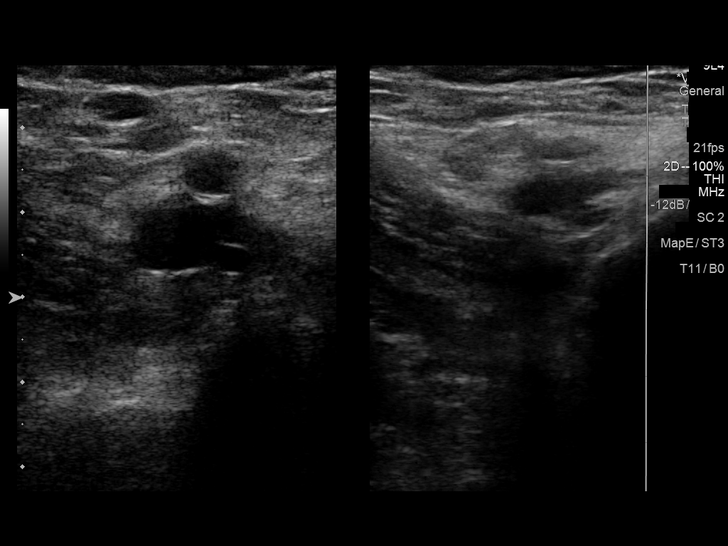
[im 7/36]
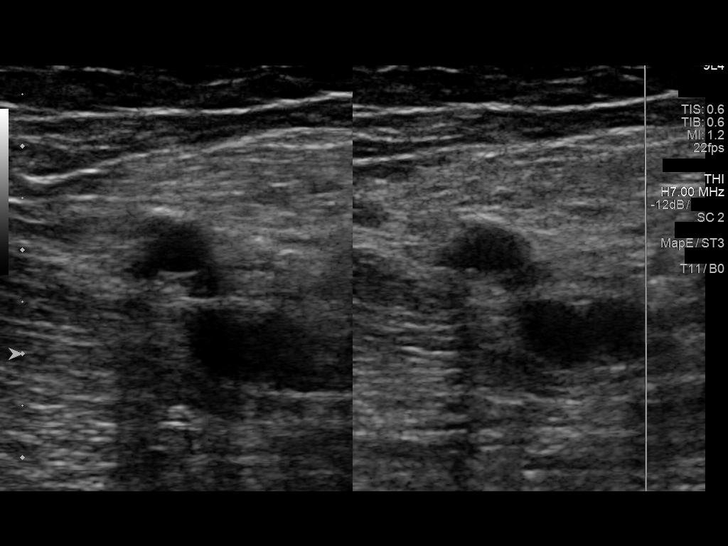
[im 10/36]
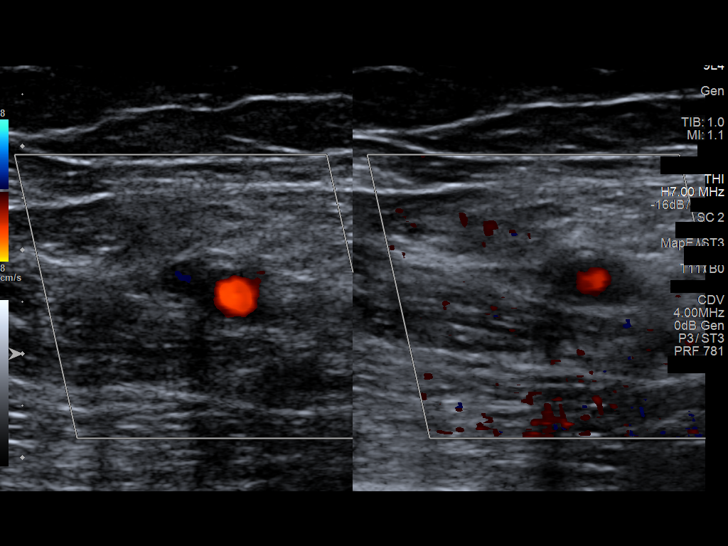
[im 13/36]
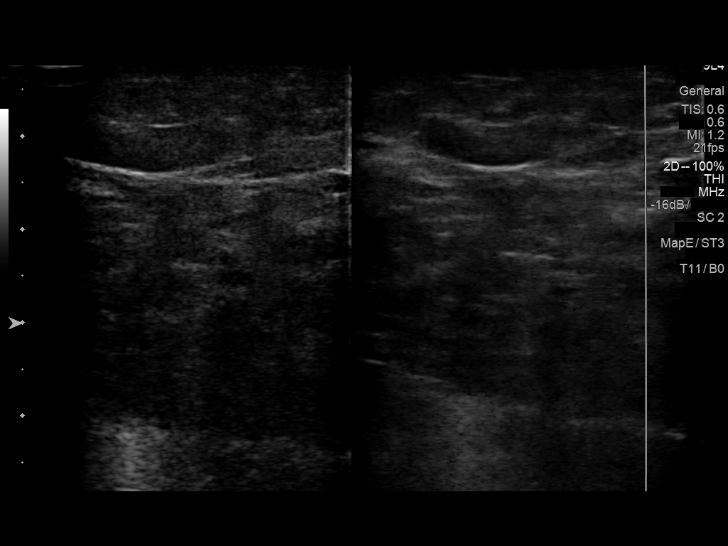
[im 16/36]
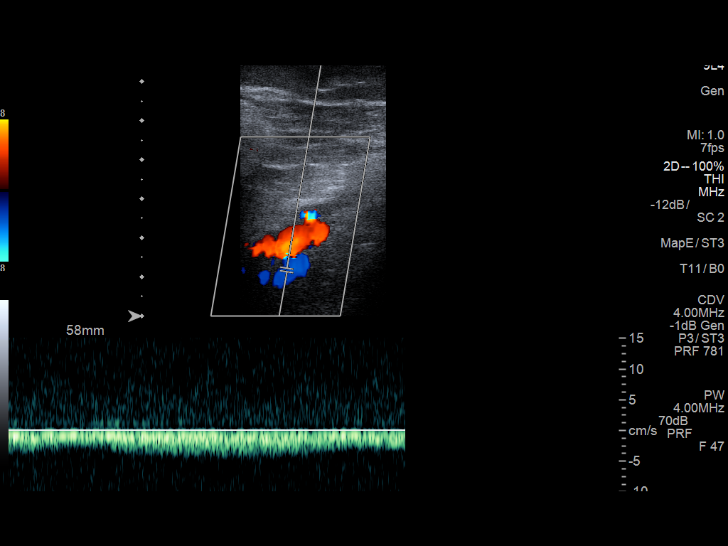
[im 19/36]
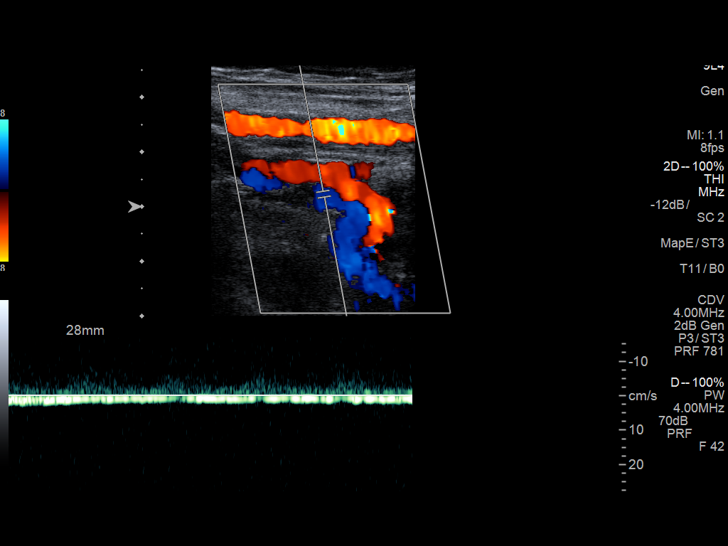
[im 20/36]
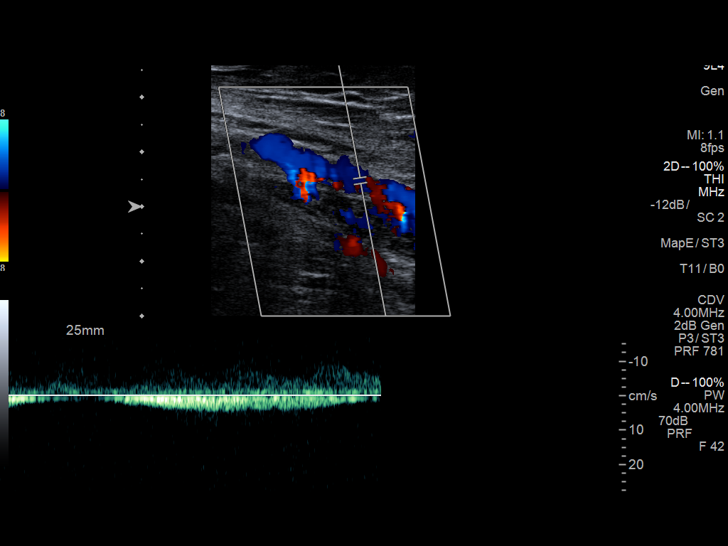
[im 23/36]
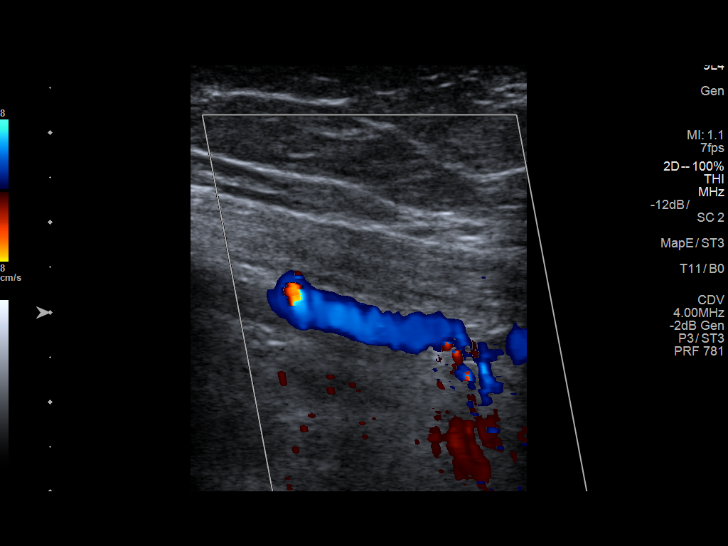
[im 26/36]
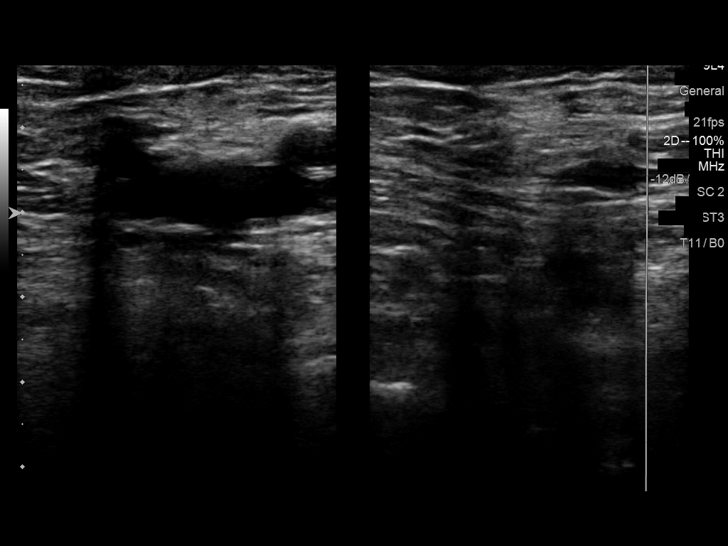
[im 29/36]
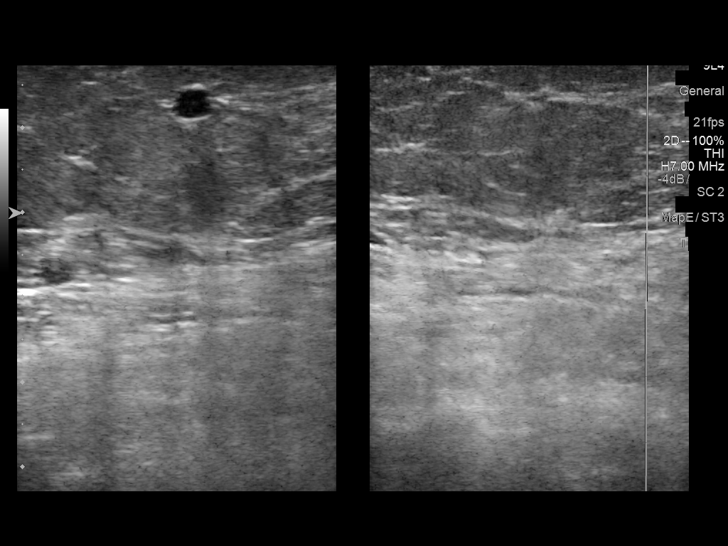
[im 32/36]
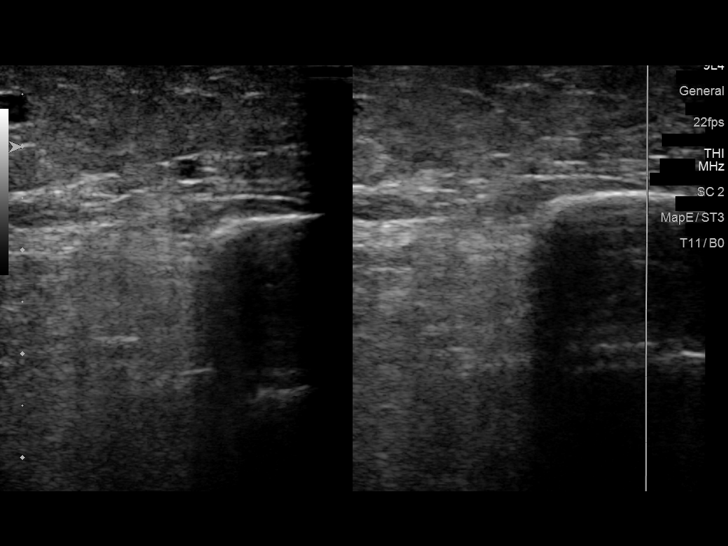
[im 36/36]
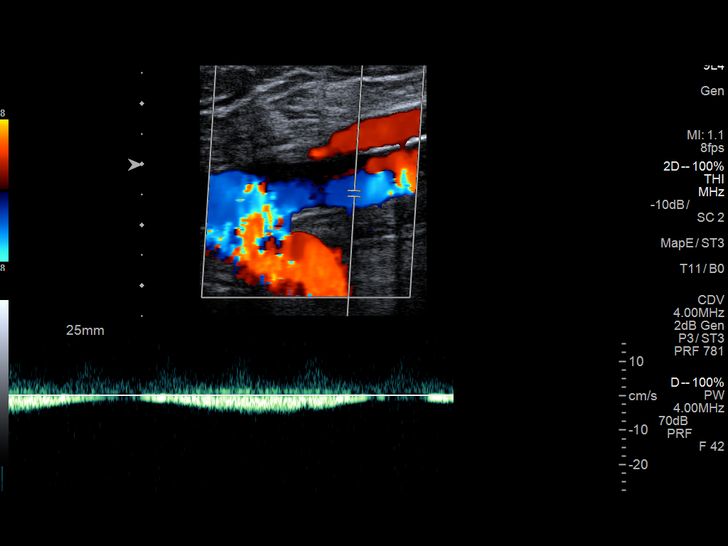

[13 of 24 positions shown; findings below may reference images not displayed]

FINDINGS: Contralateral Common Femoral Vein: Respiratory phasicity is normal
and symmetric with the symptomatic side. No evidence of thrombus.
Normal compressibility.

Common Femoral Vein: Partially compressible left common femoral
vein. There is echogenic eccentric wall adherent thrombus consistent
with largely recanalized chronic DVT. Excellent color flow
visualized on color Doppler imaging with respiratory phasicity.

Saphenofemoral Junction: No evidence of thrombus. Normal
compressibility and flow on color Doppler imaging.

Profunda Femoral Vein: No evidence of thrombus. Normal
compressibility and flow on color Doppler imaging.

Femoral Vein: No evidence of thrombus. Normal compressibility,
respiratory phasicity and response to augmentation.

Popliteal Vein: No evidence of thrombus. Normal compressibility,
respiratory phasicity and response to augmentation.

Calf Veins: No evidence of thrombus. Normal compressibility and flow
on color Doppler imaging.

Superficial Great Saphenous Vein: No evidence of thrombus. Normal
compressibility.

Venous Reflux:  None.

Other Findings:  None.
IMPRESSION: 1. Small volume of chronic, partially recanalized DVT in the left
common femoral vein.
2. Tortuosity of the femoral vein throughout the thigh may indicate
the presence of collateral formation. No definite acute or chronic
DVT identified.

## 2019-05-14 ENCOUNTER — Other Ambulatory Visit (HOSPITAL_COMMUNITY): Payer: Self-pay | Admitting: *Deleted

## 2019-05-14 ENCOUNTER — Other Ambulatory Visit: Payer: Self-pay

## 2019-05-14 ENCOUNTER — Ambulatory Visit (HOSPITAL_COMMUNITY)
Admission: RE | Admit: 2019-05-14 | Discharge: 2019-05-14 | Disposition: A | Discharge status: Home / Self Care | Payer: Medicare (Managed Care) | Source: Ambulatory Visit | Attending: Family Medicine | Admitting: Family Medicine

## 2019-05-14 DIAGNOSIS — D509 Iron deficiency anemia, unspecified: Secondary | ICD-10-CM | POA: Insufficient documentation

## 2019-05-14 LAB — ABO/RH: ABO/RH(D): B POS

## 2019-05-14 LAB — PREPARE RBC (CROSSMATCH)

## 2019-05-14 MED ORDER — SODIUM CHLORIDE 0.9% IV SOLUTION: Freq: Once | INTRAVENOUS | Status: DC

## 2019-05-15 ENCOUNTER — Encounter (HOSPITAL_COMMUNITY)
Admission: RE | Admit: 2019-05-15 | Discharge: 2019-05-15 | Disposition: A | Discharge status: Home / Self Care | Payer: Medicare (Managed Care) | Source: Ambulatory Visit | Attending: Family Medicine | Admitting: Family Medicine

## 2019-05-15 ENCOUNTER — Other Ambulatory Visit: Payer: Self-pay

## 2019-05-15 DIAGNOSIS — D509 Iron deficiency anemia, unspecified: Secondary | ICD-10-CM | POA: Insufficient documentation

## 2019-05-15 NOTE — Progress Notes (Signed)
Pt had an 0800 appt to be type and crossed and transfused with 2 units PRBC.  She had not arrived at 0815 so I called and left the granddaughter, who patient lives with, a voicemail inquiring if she was coming to this appt today.  Granddaughter returned the call at [phone] and spoke with our secretary Laverne and stated she had car trouble but was arranging transportation for the patient and please allow her to still come.  At 0900 patient was still not arrived so I called the granddaughter back and explained to her that this process takes several hours, we close at 4:00 and she still had not arrived.  The granddaughter assured me she should be here soon.  [id number] patient arrived.  I called the granddaughter back when the first unit of blood was hung at 11:00 and let her know it had gone up but at this point we would not be able to give her both units in the same day, that I was putting her on the schedule at 0800 on 12/30 to receive her second unit of blood, and asked if she would be able to have her here tomorrow by 0800 to receive it.  The granddaughter stated she would arrange transportation and verbalized understanding that she should be here at 0800 on 12/30.

## 2019-05-16 LAB — TYPE AND SCREEN
ABO/RH(D): B POS
Antibody Screen: NEGATIVE
Unit division: 0
Unit division: 0

## 2019-05-16 LAB — BPAM RBC
Blood Product Expiration Date: 202101252359
Blood Product Expiration Date: 202101262359
ISSUE DATE / TIME: 202012291042
ISSUE DATE / TIME: 202012300904
Unit Type and Rh: 7300
Unit Type and Rh: 7300

## 2019-06-12 ENCOUNTER — Emergency Department (HOSPITAL_COMMUNITY)
Admission: EM | Admit: 2019-06-12 | Discharge: 2019-06-12 | Disposition: A | Discharge status: Home / Self Care | Payer: Medicare (Managed Care) | Attending: Emergency Medicine | Admitting: Emergency Medicine

## 2019-06-12 ENCOUNTER — Encounter (HOSPITAL_COMMUNITY): Payer: Self-pay | Admitting: Emergency Medicine

## 2019-06-12 ENCOUNTER — Emergency Department (HOSPITAL_COMMUNITY): Payer: Medicare (Managed Care)

## 2019-06-12 ENCOUNTER — Emergency Department (HOSPITAL_BASED_OUTPATIENT_CLINIC_OR_DEPARTMENT_OTHER): Payer: Medicare (Managed Care)

## 2019-06-12 ENCOUNTER — Other Ambulatory Visit: Payer: Self-pay

## 2019-06-12 DIAGNOSIS — R5383 Other fatigue: Secondary | ICD-10-CM

## 2019-06-12 DIAGNOSIS — Z7901 Long term (current) use of anticoagulants: Secondary | ICD-10-CM | POA: Diagnosis not present

## 2019-06-12 DIAGNOSIS — F17220 Nicotine dependence, chewing tobacco, uncomplicated: Secondary | ICD-10-CM | POA: Diagnosis not present

## 2019-06-12 DIAGNOSIS — N3000 Acute cystitis without hematuria: Secondary | ICD-10-CM

## 2019-06-12 DIAGNOSIS — I824Z2 Acute embolism and thrombosis of unspecified deep veins of left distal lower extremity: Secondary | ICD-10-CM | POA: Insufficient documentation

## 2019-06-12 DIAGNOSIS — I10 Essential (primary) hypertension: Secondary | ICD-10-CM | POA: Insufficient documentation

## 2019-06-12 DIAGNOSIS — E876 Hypokalemia: Secondary | ICD-10-CM | POA: Diagnosis not present

## 2019-06-12 DIAGNOSIS — Z79899 Other long term (current) drug therapy: Secondary | ICD-10-CM | POA: Diagnosis not present

## 2019-06-12 DIAGNOSIS — R4182 Altered mental status, unspecified: Secondary | ICD-10-CM | POA: Insufficient documentation

## 2019-06-12 DIAGNOSIS — F0390 Unspecified dementia without behavioral disturbance: Secondary | ICD-10-CM | POA: Diagnosis not present

## 2019-06-12 DIAGNOSIS — Z8543 Personal history of malignant neoplasm of ovary: Secondary | ICD-10-CM | POA: Insufficient documentation

## 2019-06-12 DIAGNOSIS — R531 Weakness: Secondary | ICD-10-CM | POA: Diagnosis present

## 2019-06-12 LAB — CBC
HCT: 33.8 % — ABNORMAL LOW (ref 36.0–46.0)
Hemoglobin: 10 g/dL — ABNORMAL LOW (ref 12.0–15.0)
MCH: 24.5 pg — ABNORMAL LOW (ref 26.0–34.0)
MCHC: 29.6 g/dL — ABNORMAL LOW (ref 30.0–36.0)
MCV: 82.8 fL (ref 80.0–100.0)
Platelets: 377 10*3/uL (ref 150–400)
RBC: 4.08 MIL/uL (ref 3.87–5.11)
RDW: 22.6 % — ABNORMAL HIGH (ref 11.5–15.5)
WBC: 8.4 10*3/uL (ref 4.0–10.5)
nRBC: 0 % (ref 0.0–0.2)

## 2019-06-12 LAB — URINALYSIS, ROUTINE W REFLEX MICROSCOPIC
Bilirubin Urine: NEGATIVE
Glucose, UA: NEGATIVE mg/dL
Hgb urine dipstick: NEGATIVE
Ketones, ur: 80 mg/dL — AB
Nitrite: NEGATIVE
Protein, ur: 30 mg/dL — AB
Specific Gravity, Urine: 1.023 (ref 1.005–1.030)
pH: 6 (ref 5.0–8.0)

## 2019-06-12 LAB — BASIC METABOLIC PANEL
Anion gap: 16 — ABNORMAL HIGH (ref 5–15)
BUN: 6 mg/dL — ABNORMAL LOW (ref 8–23)
CO2: 19 mmol/L — ABNORMAL LOW (ref 22–32)
Calcium: 10.1 mg/dL (ref 8.9–10.3)
Chloride: 104 mmol/L (ref 98–111)
Creatinine, Ser: 0.67 mg/dL (ref 0.44–1.00)
GFR calc Af Amer: >60 mL/min (ref >60)
GFR calc non Af Amer: >60 mL/min (ref >60)
Glucose, Bld: 76 mg/dL (ref 70–99)
Potassium: 2.9 mmol/L — ABNORMAL LOW (ref 3.5–5.1)
Sodium: 139 mmol/L (ref 135–145)

## 2019-06-12 LAB — LACTIC ACID, PLASMA: Lactic Acid, Venous: 1.4 mmol/L (ref 0.5–1.9)

## 2019-06-12 LAB — CBG MONITORING, ED: Glucose-Capillary: 77 mg/dL (ref 70–99)

## 2019-06-12 LAB — TROPONIN I (HIGH SENSITIVITY)
Troponin I (High Sensitivity): 7 ng/L (ref <18)
Troponin I (High Sensitivity): 15 ng/L (ref <18)

## 2019-06-12 MED ORDER — POTASSIUM CHLORIDE CRYS ER 20 MEQ PO TBCR
40.0000 meq | EXTENDED_RELEASE_TABLET | Freq: Once | ORAL | Status: AC
  Administered 2019-06-12: 40 meq via ORAL
  Filled 2019-06-12: qty 2

## 2019-06-12 MED ORDER — MORPHINE SULFATE (PF) 4 MG/ML IV SOLN
4.0000 mg | Freq: Once | INTRAVENOUS | Status: AC
  Administered 2019-06-12: 4 mg via INTRAVENOUS
  Filled 2019-06-12: qty 1

## 2019-06-12 MED ORDER — APIXABAN 5 MG PO TABS: 5.0000 mg | ORAL_TABLET | Freq: Two times a day (BID) | ORAL | Status: DC

## 2019-06-12 MED ORDER — FOSFOMYCIN TROMETHAMINE 3 G PO PACK
3.0000 g | PACK | Freq: Once | ORAL | Status: AC
  Administered 2019-06-12: 3 g via ORAL
  Filled 2019-06-12: qty 3

## 2019-06-12 MED ORDER — APIXABAN 5 MG PO TABS
10.0000 mg | ORAL_TABLET | Freq: Two times a day (BID) | ORAL | Status: DC
  Administered 2019-06-12: 10 mg via ORAL
  Filled 2019-06-12: qty 2

## 2019-06-12 MED ORDER — SODIUM CHLORIDE 0.9% FLUSH: 3.0000 mL | Freq: Once | INTRAVENOUS | Status: DC

## 2019-06-12 MED ORDER — SODIUM CHLORIDE 0.9 % IV BOLUS
500.0000 mL | Freq: Once | INTRAVENOUS | Status: AC
  Administered 2019-06-12: 500 mL via INTRAVENOUS

## 2019-06-12 NOTE — Discharge Instructions (Signed)
Start taking Eliquis as prescribed.  Do not take any NSAIDs while on this medication.  Can take Tylenol as needed for pain.  Monitor blood pressures at home.  If your blood pressures remain elevated your doctor may restart you on your medications.   Return to the emergency department if any concerning signs or symptoms develop such as fevers, persistent vomiting, persistent chest pains.

## 2019-06-12 NOTE — ED Triage Notes (Signed)
Patient presents to the ED by EMS with c/o 2 weeks of left leg with weakness that has increased, difficulty ambulating. Decreased appetite and fluids. Denies fevers per family.   172/174 80pulse 20resp 95% room air 72 cbg 98.1temp

## 2019-06-12 NOTE — Care Management (Addendum)
Updated [facility]  On call nurse Elmyra Ricks about patient's discharge and new prescription. Confirmed patient's transport mode  PTAR with on-call nurse ED CM called to arrange non emergent ambulance. Family updated verbalized understanding teach back done.

## 2019-06-12 NOTE — ED Notes (Signed)
All appropriate discharge materials reviewed at length with patient. Time for questions provided. Pt has no other questions at this time and verbalizes understanding of all provided materials.  

## 2019-06-12 NOTE — ED Provider Notes (Signed)
[facility] EMERGENCY DEPARTMENT Provider Note   CSN: [id number] Arrival date & time: 06/12/19  1400     History Chief Complaint  Patient presents with  . Weakness   Level 5 caveat due to dementia  Karen Thompson is a 86 y.o. female with history of dementia, hypertension, peripheral neuropathy, DVT presents accompanied by granddaughter who is also her power of attorney for evaluation of gradual onset, progressively worsening altered mental status for 3 weeks with associated left lower extremity pain as well as worsening weakness over the last 2 days.  Patient's granddaughter reports she has had very little to eat over the last 2 days.  She notes that is laying fairly ambulatory and could walk short distances but over the last 3 weeks she has not been able to ambulate very much at all.  She complains of pain radiating from the left hip down to the ankle.  Granddaughter has noted mild swelling.  She was previously diagnosed with bilateral DVT and was on Eliquis but was discontinued off of this after there was concern for a GI bleed and she required admission for blood transfusion.  No fevers, nausea, or vomiting.  No complaint of abdominal pain.  She does note that the patient has been complaining of left-sided chest pains intermittently for the last week.  She feels that the patient has been more confused over the last month.  She is DNI, and is open to CPR in the event of cardiac arrest, but does not want prolonged resuscitation efforts.    The history is provided by the patient and a relative.       Past Medical History:  Diagnosis Date  . Arthritis   . Depression   . Essential hypertension   . Glaucoma   . Hearing loss   . Mild dementia ([place])   . Neuropathy   . Ovarian cancer [facility])    remote    Patient Active Problem List   Diagnosis Date Noted  . Abdominal pain 04/13/2017  . Chest pain 04/13/2017  . Dvt femoral (deep venous thrombosis) ([place]) 04/13/2017  .  Essential hypertension 04/13/2017  . Dementia ([place]) 04/13/2017  . Liver cyst 04/13/2017    Past Surgical History:  Procedure Laterality Date  . CATARACT EXTRACTION, BILATERAL    . PARTIAL HYSTERECTOMY    . SPINE SURGERY       OB History   No obstetric history on file.     No family history on file.  Social History   Tobacco Use  . Smoking status: Never Smoker  . Smokeless tobacco: Current User    Types: Snuff  Substance Use Topics  . Alcohol use: Yes    Comment: occasional wine  . Drug use: No    Home Medications Prior to Admission medications   Medication Sig Start Date End Date Taking? Authorizing Provider  acetaminophen (TYLENOL 8 HOUR) 650 MG CR tablet Take 650-1,300 mg by mouth See admin instructions. Take 1,300 mg by mouth at 7 AM, 650 mg at noontime as needed for pain, and 1,300 mg at 6 PM   Yes [provider]  Brinzolamide-Brimonidine (SIMBRINZA) 1-0.2 % SUSP Place 1 drop into both eyes 2 (two) times daily.    Yes [provider]  Cholecalciferol (VITAMIN D-3) 25 MCG (1000 UT) CAPS Take 1,000 Units by mouth every morning.    Yes [provider]  citalopram (CELEXA) 20 MG tablet Take 20 mg by mouth every morning.    Yes  [provider]  gabapentin (NEURONTIN) 300 MG capsule Take 300 mg by mouth 2 (two) times daily.   Yes [provider]  Lidocaine (SALONPAS PAIN RELIEVING) 4 % PTCH Apply 1 patch topically every 12 (twelve) hours as needed (to painful sites- left lower leg and left hip).   Yes [provider]  memantine (NAMENDA) 10 MG tablet Take 10 mg by mouth 2 (two) times daily.   Yes [provider]  Menthol, Topical Analgesic, (BIOFREEZE) 4 % GEL Apply 1 application topically 2 (two) times daily as needed (for pain- left lower leg, left hip, and other affected sites).    Yes [provider]  mirtazapine (REMERON) 15 MG tablet Take 15 mg by mouth at bedtime.   Yes [provider]    Netarsudil-Latanoprost (ROCKLATAN) 0.02-0.005 % SOLN Place 1 drop into both eyes at bedtime.    Yes [provider]  olmesartan (BENICAR) 40 MG tablet Take 40 mg by mouth every morning.    Yes [provider]  apixaban (ELIQUIS) 5 MG TABS tablet Take 1 tablet (5 mg total) by mouth 2 (two) times daily. Patient not taking: Reported on 06/12/2019 12/20/17   Hayden Rasmussen, MD    Allergies    Lactose intolerance (gi), Penicillins, and Tomato  Review of Systems   Review of Systems  Unable to perform ROS: Dementia    Physical Exam Updated Vital Signs BP (!) 166/62 (BP Location: Left Arm)   Pulse 86   Temp 98.8 F (37.1 C) (Oral)   Resp 18   SpO2 94%   Physical Exam Vitals and nursing note reviewed.  Constitutional:      General: She is not in acute distress.    Appearance: She is well-developed.  HENT:     Head: Normocephalic and atraumatic.  Eyes:     General:        Right eye: No discharge.        Left eye: No discharge.     Conjunctiva/sclera: Conjunctivae normal.     Pupils: Pupils are equal, round, and reactive to light.  Neck:     Vascular: No JVD.     Trachea: No tracheal deviation.  Cardiovascular:     Rate and Rhythm: Normal rate and regular rhythm.     Comments: 2+ radial pulses bilaterally, 1+ dp/pt pulses in the right lower extremity, faintly palpable DP/PT pulses in the left lower extremity Homans sign present on the left, trace pitting edema of the bilateral lower extremities, no palpable cords, compartments are soft  Pulmonary:     Effort: Pulmonary effort is normal.  Abdominal:     General: Abdomen is flat. Bowel sounds are normal. There is no distension.     Palpations: Abdomen is soft.     Tenderness: There is no abdominal tenderness. There is no guarding.  Musculoskeletal:     Cervical back: Normal range of motion and neck supple.  Skin:    General: Skin is warm and dry.     Capillary Refill: Capillary refill takes less than 2  seconds.     Findings: No erythema.  Neurological:     General: No focal deficit present.     Mental Status: She is alert.     Comments: Mental Status:  Alert, thought content appropriate, able to give a coherent history. Speech fluent without evidence of aphasia. Able to follow 2 step commands without difficulty.  Cranial Nerves:  II:  Peripheral visual fields grossly normal, pupils equal,  round, reactive to light III,IV, VI: ptosis not present, extra-ocular motions intact bilaterally  V,VII: smile symmetric, facial light touch sensation equal VIII: hearing grossly normal to voice  X: uvula elevates symmetrically  XI: bilateral shoulder shrug symmetric and strong XII: midline tongue extension without fassiculations Motor:  Normal tone. 5/5 strength of BUE and BLE major muscle groups including strong and equal grip strength and dorsiflexion/plantar flexion Sensory: light touch normal in all extremities.   Psychiatric:        Behavior: Behavior normal.     ED Results / Procedures / Treatments   Labs (all labs ordered are listed, but only abnormal results are displayed) Labs Reviewed  BASIC METABOLIC PANEL - Abnormal; Notable for the following components:      Result Value   Potassium 2.9 (*)    CO2 19 (*)    BUN 6 (*)    Anion gap 16 (*)    All other components within normal limits  CBC - Abnormal; Notable for the following components:   Hemoglobin 10.0 (*)    HCT 33.8 (*)    MCH 24.5 (*)    MCHC 29.6 (*)    RDW 22.6 (*)    All other components within normal limits  URINALYSIS, ROUTINE W REFLEX MICROSCOPIC - Abnormal; Notable for the following components:   Ketones, ur 80 (*)    Protein, ur 30 (*)    Leukocytes,Ua SMALL (*)    Bacteria, UA MANY (*)    All other components within normal limits  LACTIC ACID, PLASMA  CBG MONITORING, ED  CBG MONITORING, ED  TROPONIN I (HIGH SENSITIVITY)  TROPONIN I (HIGH SENSITIVITY)  TROPONIN I (HIGH SENSITIVITY)    EKG EKG  Interpretation  Date/Time:  Wednesday June 12 2019 14:09:53 EST Ventricular Rate:  76 PR Interval:  190 QRS Duration: 80 QT Interval:  414 QTC Calculation: 465 R Axis:   25 Text Interpretation: Normal sinus rhythm Minimal voltage criteria for LVH, may be normal variant ( R in aVL ) ST & T wave abnormality, consider inferior ischemia ST & T wave abnormality, consider anterior ischemia Abnormal ECG flipped t waves in inferior leads not seen on prior Confirmed by Deno Etienne [phone]) on 06/12/2019 3:45:38 PM   Radiology DG Chest 2 View  Result Date: 06/12/2019 CLINICAL DATA:  Chest pain. EXAM: CHEST - 2 VIEW COMPARISON:  April 13, 2017 FINDINGS: Very mild atelectasis and/or infiltrate is seen within the left lung base. There is a small left pleural effusion. No pneumothorax is identified. The cardiac silhouette is mildly enlarged. There is marked severity calcification of the thoracic aorta. Degenerative changes seen throughout the thoracic spine. IMPRESSION: 1. Very mild left basilar atelectasis and/or infiltrate. 2. Small left pleural effusion. Electronically Signed   By: Virgina Norfolk M.D.   On: 06/12/2019 16:32   DG Lumbar Spine Complete  Result Date: 06/12/2019 CLINICAL DATA:  Back pain. EXAM: LUMBAR SPINE - COMPLETE 4+ VIEW COMPARISON:  None. FINDINGS: There is no evidence of an acute lumbar spine fracture. A chronic compression fracture deformity is seen involving the L2 vertebral body. There is approximately 5 mm anterolisthesis of the L4 on L5 vertebral body. Mild multilevel endplate sclerosis is seen. This is most prominent at the level of L1-L2. Mild-to-moderate severity multilevel intervertebral disc space narrowing is noted. There is marked severity calcification of the abdominal aorta. IMPRESSION: 1. Chronic compression fracture deformity involving the L2 vertebral body. 2. Mild-to-moderate severity multilevel degenerative changes. Electronically Signed   By: Hoover Browns  Houston  M.D.   On: 06/12/2019 16:35   CT Head Wo Contrast  Result Date: 06/12/2019 CLINICAL DATA:  Altered mental status EXAM: CT HEAD WITHOUT CONTRAST TECHNIQUE: Contiguous axial images were obtained from the base of the skull through the vertex without intravenous contrast. COMPARISON:  None. FINDINGS: Brain: No acute territorial infarction, hemorrhage or intracranial mass. Moderate atrophy. Moderate hypodensity within the subcortical, periventricular and deep white matter, consistent with chronic small vessel ischemic change. Chronic lacunar infarcts within the right cerebellum. Ventricles are nonenlarged. Vascular: No hyperdense vessels. Vertebral and carotid vascular calcification Skull: Normal. Negative for fracture or focal lesion. Sinuses/Orbits: No acute finding. Other: None IMPRESSION: 1. No CT evidence for acute intracranial abnormality. 2. Atrophy and chronic small vessel ischemic changes of the white matter. Small chronic appearing infarcts in the right cerebellum Electronically Signed   By: Donavan Foil M.D.   On: 06/12/2019 16:44   DG Hip Unilat With Pelvis 2-3 Views Left  Result Date: 06/12/2019 CLINICAL DATA:  Left leg weakness. EXAM: DG HIP (WITH OR WITHOUT PELVIS) 2-3V LEFT COMPARISON:  None. FINDINGS: Moderate to marked severity degenerative changes seen within the visualized portion of the lower lumbar spine. Normal left femoral head, neck, intertrochanteric region and visualized proximal femur. Mild sclerotic changes are seen involving the left acetabulum. Mild lateral acetabular degenerative changes are also seen. There is mild narrowing of the left hip joint. Normal visualized superior and inferior pubic rami and ischial tuberosities. Mild vascular calcification is seen. IMPRESSION: 1. Degenerative changes, without evidence of acute osseous abnormality. Electronically Signed   By: Virgina Norfolk M.D.   On: 06/12/2019 16:37   VAS [place] LOWER EXTREMITY VENOUS (DVT) ([facility] and [facility]  7a-7p)  Result Date: 06/12/2019  Lower Venous Study Indications: Weakness.  Risk Factors: None identified. Comparison Study: 01/01/2018 - L Chronic CFV DVT Performing Technologist: Oliver Hum RVT  Examination Guidelines: A complete evaluation includes B-mode imaging, spectral Doppler, color Doppler, and power Doppler as needed of all accessible portions of each vessel. Bilateral testing is considered an integral part of a complete examination. Limited examinations for reoccurring indications may be performed as noted.  +-----+---------------+---------+-----------+----------+--------------+ RIGHTCompressibilityPhasicitySpontaneityPropertiesThrombus Aging +-----+---------------+---------+-----------+----------+--------------+ CFV  Full           Yes      Yes                                 +-----+---------------+---------+-----------+----------+--------------+   +---------+---------------+---------+-----------+----------+-----------------+ LEFT     CompressibilityPhasicitySpontaneityPropertiesThrombus Aging    +---------+---------------+---------+-----------+----------+-----------------+ CFV      Full           Yes      Yes                                    +---------+---------------+---------+-----------+----------+-----------------+ SFJ      Full                                                           +---------+---------------+---------+-----------+----------+-----------------+ FV Prox  Partial        Yes      Yes  Age Indeterminate +---------+---------------+---------+-----------+----------+-----------------+ FV Mid   Full                                                           +---------+---------------+---------+-----------+----------+-----------------+ FV DistalFull                                                           +---------+---------------+---------+-----------+----------+-----------------+ PFV      Full                                                            +---------+---------------+---------+-----------+----------+-----------------+ POP      Full           Yes      Yes                                    +---------+---------------+---------+-----------+----------+-----------------+ PTV      Partial                                      Acute             +---------+---------------+---------+-----------+----------+-----------------+ PERO     Full                                                           +---------+---------------+---------+-----------+----------+-----------------+     Summary: Right: No evidence of common femoral vein obstruction. Left: Findings consistent with acute deep vein thrombosis involving the left posterior tibial veins. Findings consistent with age indeterminate deep vein thrombosis involving the left femoral vein. No cystic structure found in the popliteal fossa.  *See table(s) above for measurements and observations.    Preliminary     Procedures Procedures (including critical care time)  Medications Ordered in ED Medications  sodium chloride flush (NS) 0.9 % injection 3 mL (3 mLs Intravenous Not Given 06/12/19 1503)  apixaban (ELIQUIS) tablet 10 mg (10 mg Oral Given 06/12/19 1905)    Followed by  apixaban (ELIQUIS) tablet 5 mg (has no administration in time range)  sodium chloride 0.9 % bolus 500 mL (0 mLs Intravenous Stopped 06/12/19 2247)  potassium chloride SA (KLOR-CON) CR tablet 40 mEq (40 mEq Oral Given 06/12/19 1834)  morphine 4 MG/ML injection 4 mg (4 mg Intravenous Given 06/12/19 1835)  fosfomycin (MONUROL) packet 3 g (3 g Oral Given 06/12/19 1905)    ED Course  I have reviewed the triage vital signs and the nursing notes.  Pertinent labs & imaging results that were available during my care of the patient were reviewed by me and considered in my medical decision making (see chart for  details).    MDM Rules/Calculators/A&P                       Patient brought in by granddaughter who is her POA for evaluation of progressively worsening altered mental status, decreased appetite, left lower extremity pain.  She is afebrile, persistently hypertensive in the ED though had improvement with administration of pain medicine.  We will obtain lab work and imaging to rule out acute osseous abnormality.  She has a history of prior DVT and was discontinued on her anticoagulation in December due to anemia.  We will obtain DVT study of the left lower extremity to rule out acute DVT.  3:25 PM Spoke with Dr. Jimmye Norman, patient's PCP.  She confirms that the patient has been more confused, has had a departure from her mental status baseline.  The patient has ischemic changes to the left leg and they are aware of this with primary goal being pain management though she has been seen by vascular surgery previously.  Dr. Jimmye Norman states that the [facility] team met yesterday to discuss Ms. Germond's care -their primary goals are to support the patient's granddaughter in caring for her at home, increasing daytime help.  They have faxed over additional information and lab work for me to review.  Lab work reviewed by me shows no leukocytosis, hemoglobin of 10.0 though this is improved from when she required blood transfusion and December when she had a hemoglobin of around 5.5.  She is hypokalemic with a potassium of 2.9 but EKG shows normal sinus rhythm, QT intervals within normal limits.  We will give her p.o. potassium in the ED and Dr. Jimmye Norman states she will reassess and potentially give further supplementation outpatient.  Her anion gap is elevated at 16 but her lactate is within normal limits which is reassuring that she is likely not septic or experiencing worsening arterial stenosis in the left lower extremity.  Elevated anion gap likely in the setting of dehydration/starvation ketosis.  Her UA is concerning for UTI, will culture.  She does have some ketones  and proteins in her urine further suggesting dehydration.  She was given IV fluids in the ED as well as a single dose of fosfomycin for treatment of UTI.  Initially the patient's granddaughter noted she had been complaining of intermittent chest pains for the last week though on reevaluation she notes that the pain is really more in the low back.  Serial troponins were obtained; per current high-sensitivity troponin algorithm it would be reasonable to discharge her home and have her follow-up with her PCP or cardiology for reevaluation of her chest pains though they sound quite atypical and likely not cardiac in nature.  She does have a chronic compression fracture deformity involving the L2 vertebral body which is known to the patient's PCP and likely source of her pains.  Remainder of imaging is unremarkable.  No acute intracranial abnormalities noted on head CT.  DVT study unfortunately shows acute appearing DVT involving the left posterior tibial veins, some chronic DVT noted as well.  I spoke with Dr. Jimmye Norman on the phone again.  We discussed the utility of reinitiating her anticoagulation.  We will hold off on restarting her aspirin but we will start her Eliquis again in the ED and pharmacy will provide education and Eliquis starter pack.  We also discussed patient's hypertension.  Dr. Jimmye Norman notes that her amlodipine which was the only blood pressure medication she was on  previously was stopped a couple of months ago due to hypotension.  We will hold off on restarting her medication at this time since she has had improving blood pressures with pain control in the ED and if her blood pressures remain persistently elevated on reevaluation may consider restarting her antihypertensive.   On reevaluation patient is resting comfortably in no apparent distress.  She is tolerating p.o. fluids without difficulty.  Patient and granddaughter feel comfortable with discharge home.  Dr. Jimmye Norman will reach out  tomorrow to schedule follow-up and to reevaluate.  Discussed strict ED return precautions.  Patient's granddaughter verbalized understanding of and agreement with plan and patient stable for discharge home at this time.  Patient was seen and evaluated by Dr. Tyrone Nine who agrees with assessment and plan at this time.  Final Clinical Impression(s) / ED Diagnoses Final diagnoses:  Acute cystitis without hematuria  Hypertension, unspecified type  Hypokalemia  Acute deep vein thrombosis (DVT) of distal vein of left lower extremity [facility])    Rx / DC Orders ED Discharge Orders    None       Renita Papa, PA-C 06/13/19 [place], Dan, DO 06/13/19 2114

## 2019-06-12 NOTE — Progress Notes (Signed)
ANTICOAGULATION CONSULT NOTE - Initial Consult  Pharmacy Consult for Apixaban Indication: DVT  Allergies  Allergen Reactions  . Lactose Intolerance (Gi)   . Penicillins     Has patient had a PCN reaction causing immediate rash, facial/tongue/throat swelling, SOB or lightheadedness with hypotension: Y Has patient had a PCN reaction causing severe rash involving mucus membranes or skin necrosis: N Has patient had a PCN reaction that required hospitalization: N Has patient had a PCN reaction occurring within the last 10 years: N If all of the above answers are "NO", then may proceed with Cephalosporin use.   . Tomato Itching and Rash    Raw tomatoes    Patient Measurements:   Heparin Dosing Weight:   Vital Signs: Temp: 98.8 F (37.1 C) (01/27 1502) Temp Source: Oral (01/27 1502) BP: 213/78 (01/27 1720) Pulse Rate: 83 (01/27 1720)  Labs: Recent Labs    06/12/19 1411 06/12/19 1514  HGB 10.0*  --   HCT 33.8*  --   PLT 377  --   CREATININE 0.67  --   TROPONINIHS  --  7    CrCl cannot be calculated (Unknown ideal weight.).   Medical History: Past Medical History:  Diagnosis Date  . Arthritis   . Depression   . Essential hypertension   . Glaucoma   . Hearing loss   . Mild dementia ([place])   . Neuropathy   . Ovarian cancer ([place])    remote    Medications:  Scheduled:  . apixaban  10 mg Oral BID   Followed by  . [START ON 06/19/2019] apixaban  5 mg Oral BID  . potassium chloride  40 mEq Oral Once  . sodium chloride flush  3 mL Intravenous Once    Assessment: Patient is a 63 yof that presented to the ED with weakness and symptoms similar to a DVT. The patient has a hx of DVTs and was previously on Apixaban however was stopped recently due to concerns for a GI bleed requiring transfusion. Pharmacy has been asked to dose Apixaban for a new DVT at this time.  Goal of Therapy:  Monitor platelets by anticoagulation protocol: Yes   Plan:  - Patient CBC appears  stable at this time  - Apixaban 10mg  PO BID x 7 days  - Followed by Apixaban 5mg  PO BID  - Recommend close follow up with PCP to monitor CBC  - Caregiver educated on Apixaban and s/s of bleeding   Duanne Limerick PharmD. BCPS  06/12/2019,5:43 PM

## 2019-06-12 NOTE — Progress Notes (Signed)
Left lower extremity venous duplex has been completed. Preliminary results can be found in CV Proc through chart review.  Results were given to [facility].  06/12/19 5:28 PM Carlos Levering RVT

## 2019-09-14 DEATH — deceased
